# Patient Record
Sex: Female | Born: 1980
Health system: Southern US, Community
[De-identification: ages and names within clinical notes are randomized; demographics above are authoritative.]

## PROBLEM LIST (undated history)

## (undated) DIAGNOSIS — D649 Anemia, unspecified: Secondary | ICD-10-CM

## (undated) DIAGNOSIS — M549 Dorsalgia, unspecified: Secondary | ICD-10-CM

## (undated) DIAGNOSIS — M25569 Pain in unspecified knee: Secondary | ICD-10-CM

## (undated) DIAGNOSIS — E669 Obesity, unspecified: Secondary | ICD-10-CM

## (undated) HISTORY — DX: Dorsalgia, unspecified: M54.9

## (undated) HISTORY — DX: Pain in unspecified knee: M25.569

## (undated) HISTORY — DX: Anemia, unspecified: D64.9

## (undated) HISTORY — DX: Obesity, unspecified: E66.9

---

## 1998-07-06 ENCOUNTER — Emergency Department (HOSPITAL_COMMUNITY): Admission: EM | Admit: 1998-07-06 | Discharge: 1998-07-06 | Payer: Self-pay | Admitting: Emergency Medicine

## 1998-07-06 ENCOUNTER — Encounter: Payer: Self-pay | Admitting: Emergency Medicine

## 1998-07-15 ENCOUNTER — Emergency Department (HOSPITAL_COMMUNITY): Admission: EM | Admit: 1998-07-15 | Discharge: 1998-07-15 | Payer: Self-pay | Admitting: Emergency Medicine

## 1998-07-15 ENCOUNTER — Encounter: Payer: Self-pay | Admitting: Emergency Medicine

## 1999-04-04 ENCOUNTER — Ambulatory Visit (HOSPITAL_COMMUNITY): Admission: RE | Admit: 1999-04-04 | Discharge: 1999-04-04 | Payer: Self-pay | Admitting: Obstetrics

## 1999-08-12 ENCOUNTER — Ambulatory Visit (HOSPITAL_COMMUNITY): Admission: RE | Admit: 1999-08-12 | Discharge: 1999-08-12 | Payer: Self-pay | Admitting: Obstetrics

## 1999-09-16 ENCOUNTER — Encounter (HOSPITAL_COMMUNITY): Admission: RE | Admit: 1999-09-16 | Discharge: 1999-09-27 | Payer: Self-pay | Admitting: *Deleted

## 1999-09-26 ENCOUNTER — Inpatient Hospital Stay (HOSPITAL_COMMUNITY): Admission: AD | Admit: 1999-09-26 | Discharge: 1999-09-28 | Payer: Self-pay | Admitting: *Deleted

## 2003-10-01 ENCOUNTER — Encounter: Admission: RE | Admit: 2003-10-01 | Discharge: 2003-10-01 | Payer: Self-pay | Admitting: Obstetrics and Gynecology

## 2004-01-19 ENCOUNTER — Other Ambulatory Visit: Admission: RE | Admit: 2004-01-19 | Discharge: 2004-01-19 | Payer: Self-pay | Admitting: Family Medicine

## 2004-01-19 ENCOUNTER — Encounter: Admission: RE | Admit: 2004-01-19 | Discharge: 2004-01-19 | Payer: Self-pay | Admitting: Family Medicine

## 2004-09-20 ENCOUNTER — Ambulatory Visit: Payer: Self-pay | Admitting: Obstetrics and Gynecology

## 2004-09-29 ENCOUNTER — Other Ambulatory Visit: Admission: RE | Admit: 2004-09-29 | Discharge: 2004-09-29 | Payer: Self-pay | Admitting: Obstetrics and Gynecology

## 2004-09-29 ENCOUNTER — Ambulatory Visit: Payer: Self-pay | Admitting: Family Medicine

## 2011-06-05 DIAGNOSIS — Z3042 Encounter for surveillance of injectable contraceptive: Secondary | ICD-10-CM | POA: Insufficient documentation

## 2012-05-21 DIAGNOSIS — R87610 Atypical squamous cells of undetermined significance on cytologic smear of cervix (ASC-US): Secondary | ICD-10-CM | POA: Insufficient documentation

## 2013-06-18 DIAGNOSIS — N871 Moderate cervical dysplasia: Secondary | ICD-10-CM | POA: Insufficient documentation

## 2014-06-04 ENCOUNTER — Other Ambulatory Visit: Payer: Self-pay | Admitting: Obstetrics and Gynecology

## 2014-06-05 LAB — CYTOLOGY - PAP

## 2015-10-12 DIAGNOSIS — Z3042 Encounter for surveillance of injectable contraceptive: Secondary | ICD-10-CM | POA: Diagnosis not present

## 2015-12-28 DIAGNOSIS — Z3040 Encounter for surveillance of contraceptives, unspecified: Secondary | ICD-10-CM | POA: Diagnosis not present

## 2016-03-14 DIAGNOSIS — Z3040 Encounter for surveillance of contraceptives, unspecified: Secondary | ICD-10-CM | POA: Diagnosis not present

## 2016-06-07 DIAGNOSIS — Z0000 Encounter for general adult medical examination without abnormal findings: Secondary | ICD-10-CM | POA: Diagnosis not present

## 2016-06-07 DIAGNOSIS — Z3042 Encounter for surveillance of injectable contraceptive: Secondary | ICD-10-CM | POA: Diagnosis not present

## 2016-06-07 DIAGNOSIS — Z6841 Body Mass Index (BMI) 40.0 and over, adult: Secondary | ICD-10-CM | POA: Diagnosis not present

## 2016-07-17 DIAGNOSIS — Z6841 Body Mass Index (BMI) 40.0 and over, adult: Secondary | ICD-10-CM | POA: Diagnosis not present

## 2016-07-17 DIAGNOSIS — Z01419 Encounter for gynecological examination (general) (routine) without abnormal findings: Secondary | ICD-10-CM | POA: Diagnosis not present

## 2016-09-06 DIAGNOSIS — Z3040 Encounter for surveillance of contraceptives, unspecified: Secondary | ICD-10-CM | POA: Diagnosis not present

## 2016-10-16 DIAGNOSIS — R87613 High grade squamous intraepithelial lesion on cytologic smear of cervix (HGSIL): Secondary | ICD-10-CM | POA: Diagnosis not present

## 2016-10-16 DIAGNOSIS — N871 Moderate cervical dysplasia: Secondary | ICD-10-CM | POA: Diagnosis not present

## 2016-11-28 DIAGNOSIS — R87613 High grade squamous intraepithelial lesion on cytologic smear of cervix (HGSIL): Secondary | ICD-10-CM | POA: Diagnosis not present

## 2016-11-28 DIAGNOSIS — Z3042 Encounter for surveillance of injectable contraceptive: Secondary | ICD-10-CM | POA: Diagnosis not present

## 2016-11-28 DIAGNOSIS — N871 Moderate cervical dysplasia: Secondary | ICD-10-CM | POA: Diagnosis not present

## 2017-01-02 ENCOUNTER — Encounter: Payer: Self-pay | Admitting: Gynecology

## 2017-01-02 ENCOUNTER — Telehealth: Payer: Self-pay | Admitting: Gynecology

## 2017-01-02 NOTE — Telephone Encounter (Signed)
Appt scheduled for the pt to see Dr. Stanford Breedlarke-Pearson on 7/13 at 945am. Will fax a letter to the referring office to notify the pt and mail a letter to the pt.

## 2017-01-12 ENCOUNTER — Ambulatory Visit: Payer: Self-pay | Admitting: Gynecology

## 2017-01-30 NOTE — Progress Notes (Signed)
Consult Note: Gyn-Onc New Patient Visit  Elizabeth Escobar 36 y.o. female  CC:  Chief Complaint  Patient presents with  . CIN III with severe dysplasia    HPI: Patient is seen today in consultation at the request of Dr. Zelphia CairoGretchen Escobar. Primary care physician Dr. Asencion Escobar Andy.  Patient is a very pleasant 36 year old gravida 2 para 1 who is referred to us secondary to high grade dysplasia on LEEP specimen. The patient states that she has had abnormal Pap smears for about 17 years and had biopsies previously but has never had an excisional procedure before. She underwent a LEEP on 11/28/2016. Pathology revealed on the anterior portion of the cervix high-grade dysplasia CIN 2 with negative margin. On the posterior LEEP specimen showed high-grade dysplasia involving the transformation zone. There was extensive involvement of the endocervical glands. There is no invasion. She had a positive margin. On the procedure note is very clear that they did use roller ball cautery to cauterize all edges and the base of the cervical canal. She is here for discussion of follow-up with these results in mind. She's currently on Depo-Provera which she's been on for 6 years. She received her last injection on May 28. She's not currently sexually active. In the past when she has been she's not experienced any dyspareunia or postcoital bleeding. She really denies any complaints of abdominal or pelvic pain. She denies a change in her bowel or bladder habits. She states that she voids a great deal but also thinks that she drinks a lot of water. She's had one spontaneous vaginal delivery. She has a son who 38 years old. She thinks her mother had a type of cancer who and she received chemotherapy but she does not know the details. Her mother is still living. She has a sister who has lupus involving the skin.  Review of Systems: As above  Current Meds:  No outpatient encounter prescriptions on file as of 01/31/2017.   No  facility-administered encounter medications on file as of 01/31/2017.    Allergy: No Known Allergies  Social Hx:   Social History   Social History  . Marital status: Single    Spouse name: N/A  . Number of children: N/A  . Years of education: N/A   Occupational History  . Not on file.   Social History Main Topics  . Smoking status: Never Smoker  . Smokeless tobacco: Never Used  . Alcohol use No  . Drug use: No  . Sexual activity: Not on file   Other Topics Concern  . Not on file   Social History Narrative  . No narrative on file    Past Surgical Hx: History reviewed. No pertinent surgical history.  Past Medical Hx: History reviewed. No pertinent past medical history.  Oncology Hx:   No history exists.    Family Hx:  Family History  Problem Relation Age of Onset  . Cancer Mother   . Lupus Sister     Vitals:  Blood pressure (!) 156/95, pulse 71, temperature 98.3 F (36.8 C), temperature source Oral, resp. rate 18, weight (!) 386 lb 8 oz (175.3 kg), SpO2 99 %.  Physical Exam: Well-nourished well-developed female in no acute distress.  Assessment/Plan: 36 year old gravida 2 para 1 status post LEEP with high-grade dysplasia and positive endocervical margins. She and I discussed the results. I discussed with her that this has an approximate 15% risk of recurrence. The 3 options rediscussed were conservative follow-up with repeat Pap smear 6 months,  repeat excisional procedure to obtain negative margins, ultimately hysterectomy. While in some way she would be interested hysterectomy she cannot take time off work. Also discussed with her that her morbid obesity would pose a significant challenge from the surgical perspective. After discussion she wishes to have a repeat Pap smear in 6 months. I think that this is reasonable. Should she have persistent dysplasia at that time that I think further consideration of a repeat excisional procedure versus definitive therapy would be  indicated.  We discussed her obesity as it is probably a longer term health issue that even her dysplasia. She states that her insurance will not cover bariatric surgery and that she has asked about this before. She states she's also spoken to her primary care physician about medications for weight loss and those have not been encouraged for her. She has been seen by a nutritionist in the past. She got somewhat tearful today discussing the challenges with weight loss as she works 2 jobs and is very tired and does not feel like she can adequately exercise. She states that her current primary physician will be retiring and I suggested that when she has a new provider to bring this issue up again. [facility] that [facility] by family practice and that would be an option for her.  For her cervical dysplasia at this time she wishes to follow-up with Dr. Renaldo FiddlerAdkins in 6 months. I discussed with her that I would send Dr. Renaldo FiddlerAdkins a note from today. Encouraged her to call Dr. Renaldo FiddlerAdkins office and schedule an appointment.  We appreciate the opportunity to partner in the care of this very pleasant patient. She has my contact information and is welcome to call me should she have any questions.  Dorina Ribaudo A., MD 01/31/2017, 9:01 AM

## 2017-01-31 ENCOUNTER — Encounter: Payer: Self-pay | Admitting: Gynecologic Oncology

## 2017-01-31 ENCOUNTER — Ambulatory Visit: Payer: BLUE CROSS/BLUE SHIELD | Attending: Gynecologic Oncology | Admitting: Gynecologic Oncology

## 2017-01-31 VITALS — BP 156/95 | HR 71 | Temp 98.3°F | Resp 18 | Wt 386.5 lb

## 2017-01-31 DIAGNOSIS — Z8269 Family history of other diseases of the musculoskeletal system and connective tissue: Secondary | ICD-10-CM | POA: Insufficient documentation

## 2017-01-31 DIAGNOSIS — Z809 Family history of malignant neoplasm, unspecified: Secondary | ICD-10-CM | POA: Diagnosis not present

## 2017-01-31 DIAGNOSIS — D069 Carcinoma in situ of cervix, unspecified: Secondary | ICD-10-CM | POA: Insufficient documentation

## 2017-01-31 NOTE — Patient Instructions (Addendum)
Follow up with Dr. Renaldo FiddlerAdkins as scheduled.  Call Dr. Denman GeorgeGehrig's office if any concerns.

## 2017-02-14 DIAGNOSIS — Z3042 Encounter for surveillance of injectable contraceptive: Secondary | ICD-10-CM | POA: Diagnosis not present

## 2017-04-13 ENCOUNTER — Ambulatory Visit
Admission: RE | Admit: 2017-04-13 | Discharge: 2017-04-13 | Disposition: A | Discharge status: Home / Self Care | Payer: BLUE CROSS/BLUE SHIELD | Source: Ambulatory Visit | Attending: Sports Medicine | Admitting: Sports Medicine

## 2017-04-13 ENCOUNTER — Encounter (INDEPENDENT_AMBULATORY_CARE_PROVIDER_SITE_OTHER): Payer: Self-pay

## 2017-04-13 ENCOUNTER — Ambulatory Visit (INDEPENDENT_AMBULATORY_CARE_PROVIDER_SITE_OTHER): Payer: BLUE CROSS/BLUE SHIELD | Admitting: Sports Medicine

## 2017-04-13 ENCOUNTER — Other Ambulatory Visit: Payer: Self-pay | Admitting: Family Medicine

## 2017-04-13 VITALS — BP 151/71 | Ht 64.5 in | Wt 374.0 lb

## 2017-04-13 DIAGNOSIS — M25562 Pain in left knee: Principal | ICD-10-CM

## 2017-04-13 DIAGNOSIS — M25561 Pain in right knee: Secondary | ICD-10-CM

## 2017-04-13 DIAGNOSIS — M179 Osteoarthritis of knee, unspecified: Secondary | ICD-10-CM | POA: Diagnosis not present

## 2017-04-13 MED ORDER — MELOXICAM 15 MG PO TABS: 15.0000 mg | ORAL_TABLET | Freq: Every day | ORAL | 1 refills | Status: DC

## 2017-04-13 NOTE — Progress Notes (Signed)
Chief complaint: Bilateral knee pain, left greater than right 6 months  History of present illness: Elizabeth Escobar is a 36 year old female who presents to the sports medicine office today with chief complaint of bilateral knee pain. She reports that symptoms have been present for proximally 6 months. She reports that her left knee is more bothersome than her right knee. She reports that she does not recall of any inciting incident, trauma, or injury to explain the pain. She describes the pain as a sharp throbbing pain. She points to both the medial lateral joint lines of both knees as to where pain is at. She reports that she does work 2 jobs, one at [employer] and 1 at [employer], stands for good 10 to 12 hours a day on a concrete floor. She does wear tennis shoes. She reports pain is aggravated by knee flexion and excess standing. She reports that she has tried occasional Aleve, with minimal improvement in her symptoms. She does report of popping, locking, catching sensation, does not report of any symptoms of giving way. Does not report of any numbness, tingling, or burning paresthesias. She does not report of any hip pain or low back pain. She is not report of any previous knee injury. She does report a family history of osteoarthritis. He reports pain has been gradually worsening over the last 6 months, today describes the pain as a 4/10. Pain does not wake her up at nighttime. Has not tried cryotherapy or bracing.  Review of systems:  As stated above  Her past medical history, surgical history, family history, and social history reviewed. She is morbidly obese with a BMI of 63, no history of diabetes, she does not report of any current tobacco use. She does report a family history of osteoarthritis. No surgeries involving knees.  Physical exam: Vital signs are reviewed and are documented in the chart Gen.: Alert, oriented, appears stated age, in no apparent distress, is super morbidly obese HEENT: Moist  oral mucosa Respiratory: Normal respirations, able to speak in full sentences Cardiac: Regular rate, distal pulses 2+ Integumentary: No rashes on visible skin:  Neurologic: Strength 5/5, sensation 2+ in bilateral lower extremities Psych: Normal affect, mood is described as good Musculoskeletal: Inspection of her bilateral knees reveal no obvious deformity or muscle atrophy, no warmth, erythema, ecchymosis noted, difficult to appreciate any effusion due to her body habitus and size of her knee, she is tender to palpation over the medial lateral joint line bilaterally, range of motion is from 0 to 110 on the right and from 0 to 90 on the left, evidence of ligamentous instability  Assessment and plan: 1. Bilateral knee pain, suspect from primary osteoarthritis 2. Super morbid obesity with BMI of 63  Bilateral knee pain -Discussed with patient that symptoms are most consistent with primary osteoarthritis, no evidence of ligamentous instability -Discussed that she will need to get x-ray to further evaluate her knees -Discussed weight loss and excess weight causing increased pressure and strain on her knees -Did have patient fitted with green insoles for comfort and support, discussed of custom orthotics if she would like to have this done in one to 2 months -Will prescribe meloxicam 15 mg daily for the next 2 weeks, then as needed thereafter afterwards  She will follow-up here on as-needed basis.   Haynes Kerns, M.D. Primary Care Sports Medicine Fellow [facility]

## 2017-04-17 ENCOUNTER — Telehealth: Payer: Self-pay | Admitting: Family Medicine

## 2017-04-17 NOTE — Telephone Encounter (Signed)
Called the patient's cell phone number, she did not answer her cell phone.  Left detailed voice message regarding her X-ray results of her bilateral knees.  Discussed with her that she does have severe medial compartment degenerative osteoarthritis of her right knee, minimal osteoarthritis in her left knee.  Discussed to continue with meloxicam 15 mg daily for the next 2 weeks, then daily as needed there afterwards.  Discussed importance of weight loss.  Discussed to call office with any additional questions. Discussed to call if she would like to have custom orthotics.  Haynes Kerns, MD Primary Care Sports Medicine Fellow [facility]

## 2017-04-18 ENCOUNTER — Other Ambulatory Visit: Payer: Self-pay

## 2017-04-18 MED ORDER — MELOXICAM 15 MG PO TABS: 15.0000 mg | ORAL_TABLET | Freq: Every day | ORAL | 1 refills | Status: AC

## 2017-05-02 DIAGNOSIS — Z3042 Encounter for surveillance of injectable contraceptive: Secondary | ICD-10-CM | POA: Diagnosis not present

## 2017-07-20 DIAGNOSIS — Z3042 Encounter for surveillance of injectable contraceptive: Secondary | ICD-10-CM | POA: Diagnosis not present

## 2017-08-30 DIAGNOSIS — Z01419 Encounter for gynecological examination (general) (routine) without abnormal findings: Secondary | ICD-10-CM | POA: Diagnosis not present

## 2017-08-30 DIAGNOSIS — Z6841 Body Mass Index (BMI) 40.0 and over, adult: Secondary | ICD-10-CM | POA: Diagnosis not present

## 2017-08-30 DIAGNOSIS — R87613 High grade squamous intraepithelial lesion on cytologic smear of cervix (HGSIL): Secondary | ICD-10-CM | POA: Diagnosis not present

## 2017-10-12 DIAGNOSIS — Z6841 Body Mass Index (BMI) 40.0 and over, adult: Secondary | ICD-10-CM | POA: Diagnosis not present

## 2017-10-12 DIAGNOSIS — Z3042 Encounter for surveillance of injectable contraceptive: Secondary | ICD-10-CM | POA: Diagnosis not present

## 2017-10-12 DIAGNOSIS — Z0000 Encounter for general adult medical examination without abnormal findings: Secondary | ICD-10-CM | POA: Diagnosis not present

## 2017-12-28 DIAGNOSIS — Z3042 Encounter for surveillance of injectable contraceptive: Secondary | ICD-10-CM | POA: Diagnosis not present

## 2018-03-15 DIAGNOSIS — Z3042 Encounter for surveillance of injectable contraceptive: Secondary | ICD-10-CM | POA: Diagnosis not present

## 2018-06-07 DIAGNOSIS — Z3042 Encounter for surveillance of injectable contraceptive: Secondary | ICD-10-CM | POA: Diagnosis not present

## 2018-06-13 DIAGNOSIS — M79652 Pain in left thigh: Secondary | ICD-10-CM | POA: Diagnosis not present

## 2018-07-16 DIAGNOSIS — M79652 Pain in left thigh: Secondary | ICD-10-CM | POA: Diagnosis not present

## 2018-07-18 DIAGNOSIS — M79652 Pain in left thigh: Secondary | ICD-10-CM | POA: Diagnosis not present

## 2018-08-23 DIAGNOSIS — Z3042 Encounter for surveillance of injectable contraceptive: Secondary | ICD-10-CM | POA: Diagnosis not present

## 2018-11-15 DIAGNOSIS — Z3042 Encounter for surveillance of injectable contraceptive: Secondary | ICD-10-CM | POA: Diagnosis not present

## 2019-01-01 DIAGNOSIS — R87611 Atypical squamous cells cannot exclude high grade squamous intraepithelial lesion on cytologic smear of cervix (ASC-H): Secondary | ICD-10-CM | POA: Diagnosis not present

## 2019-01-01 DIAGNOSIS — Z6841 Body Mass Index (BMI) 40.0 and over, adult: Secondary | ICD-10-CM | POA: Diagnosis not present

## 2019-01-01 DIAGNOSIS — Z01419 Encounter for gynecological examination (general) (routine) without abnormal findings: Secondary | ICD-10-CM | POA: Diagnosis not present

## 2019-01-31 DIAGNOSIS — Z3042 Encounter for surveillance of injectable contraceptive: Secondary | ICD-10-CM | POA: Diagnosis not present

## 2019-05-02 DIAGNOSIS — Z3042 Encounter for surveillance of injectable contraceptive: Secondary | ICD-10-CM | POA: Diagnosis not present

## 2019-05-14 ENCOUNTER — Other Ambulatory Visit: Payer: Self-pay

## 2019-05-14 ENCOUNTER — Encounter: Payer: Self-pay | Admitting: Family Medicine

## 2019-05-14 ENCOUNTER — Encounter (INDEPENDENT_AMBULATORY_CARE_PROVIDER_SITE_OTHER): Payer: Self-pay | Admitting: Family Medicine

## 2019-05-14 ENCOUNTER — Ambulatory Visit (INDEPENDENT_AMBULATORY_CARE_PROVIDER_SITE_OTHER): Payer: Self-pay | Admitting: Family Medicine

## 2019-05-14 ENCOUNTER — Ambulatory Visit (INDEPENDENT_AMBULATORY_CARE_PROVIDER_SITE_OTHER): Payer: BC Managed Care – PPO | Admitting: Family Medicine

## 2019-05-14 VITALS — BP 140/80 | HR 82 | Temp 98.1°F | Ht 65.0 in | Wt >= 6400 oz

## 2019-05-14 DIAGNOSIS — R358 Other polyuria: Secondary | ICD-10-CM

## 2019-05-14 DIAGNOSIS — F3289 Other specified depressive episodes: Secondary | ICD-10-CM

## 2019-05-14 DIAGNOSIS — G479 Sleep disorder, unspecified: Secondary | ICD-10-CM

## 2019-05-14 DIAGNOSIS — R739 Hyperglycemia, unspecified: Secondary | ICD-10-CM | POA: Diagnosis not present

## 2019-05-14 DIAGNOSIS — R5383 Other fatigue: Secondary | ICD-10-CM | POA: Diagnosis not present

## 2019-05-14 DIAGNOSIS — M25562 Pain in left knee: Secondary | ICD-10-CM

## 2019-05-14 DIAGNOSIS — Z9189 Other specified personal risk factors, not elsewhere classified: Secondary | ICD-10-CM | POA: Diagnosis not present

## 2019-05-14 DIAGNOSIS — R0602 Shortness of breath: Secondary | ICD-10-CM | POA: Diagnosis not present

## 2019-05-14 DIAGNOSIS — R3589 Other polyuria: Secondary | ICD-10-CM

## 2019-05-14 DIAGNOSIS — Z0289 Encounter for other administrative examinations: Secondary | ICD-10-CM

## 2019-05-14 NOTE — Progress Notes (Signed)
Office: [phone]  /  Fax: [phone]   Dear Elizabeth Claude, NP,   Thank you for referring Elizabeth Escobar to our clinic. The following note includes my evaluation and treatment recommendations.  HPI:   Chief Complaint: OBESITY    Scientist, forensic has been referred by Elizabeth Solomons, NP for consultation regarding her obesity and obesity related comorbidities.    Elizabeth Escobar (MR# [id number]) is a 38 y.o. female who presents on 05/14/2019 for obesity evaluation and treatment. Current BMI is Body mass index is 70.89 kg/m.Marland Kitchen Elizabeth Escobar has been struggling with her weight for many years and has been unsuccessful in either losing weight, maintaining weight loss, or reaching her healthy weight goal.     Elizabeth Escobar states she is currently in the action stage of change and ready to dedicate time achieving and maintaining a healthier weight. Elizabeth Escobar is interested in becoming our patient and working on intensive lifestyle modifications including (but not limited to) diet, exercise and weight loss.    Elizabeth Escobar states she thinks her family will eat healthier with her she struggles with family and or coworkers weight loss sabotage she has been heavy most of her life she started gaining weight at 38 years old her heaviest weight ever was 426 lbs. she craves cookies, chips, grapes and fruit she skips lunch frequently she is frequently drinking liquids with calories she has binge eating behaviors   Fatigue Elizabeth Escobar feels her energy is lower than it should be. This has worsened with weight gain and has worsened recently. Elizabeth Escobar denies daytime somnolence and  admits to waking up still tired. Patient generally gets 7 hours of sleep per night, and states they generally do not sleep well most nights. Snoring is present. Apneic episodes are not present. Epworth Sleepiness Score is 15. Elizabeth Escobar works 40-60 hours per week - 2 jobs - and is on her feet most of the day. She reports poor quality of  sleep.  Dyspnea on exertion Elizabeth Escobar notes increasing shortness of breath with exercising and seems to be worsening over time with weight gain. She notes getting out of breath sooner with activity than she used to. This has gotten worse recently. Elizabeth Escobar denies orthopnea.  Hyperglycemia Elizabeth Escobar has a history of some elevated blood glucose readings without a diagnosis of diabetes. She drinks sugar based drinks - regular soda, sweet tea, and smoothies.  At risk for diabetes Elizabeth Escobar is at higher than average risk for developing diabetes due to her obesity. She currently denies polyuria or polydipsia.  Depression with emotional eating behaviors Elizabeth Escobar is struggling with emotional eating and using food for comfort to the extent that it is negatively impacting her health. She often snacks when she is not hungry. Elizabeth Escobar sometimes feels she is out of control and then feels guilty that she made poor food choices. She has been working on behavior modification techniques to help reduce her emotional eating and has been somewhat successful. Elizabeth Escobar states she craves sweets. Her PHQ-9 score today was 18. She shows no sign of suicidal or homicidal ideations.  Depression Screen Elizabeth Escobar's Food and Mood (modified PHQ-9) score was 18. Depression screen PHQ 2/9 05/14/2019  Decreased Interest 3  Down, Depressed, Hopeless 3  PHQ - 2 Score 6  Altered sleeping 0  Tired, decreased energy 3  Change in appetite 0  Feeling bad or failure about yourself  3  Trouble concentrating 3  Moving slowly or fidgety/restless 3  Suicidal thoughts 0  PHQ-9 Score 18  Difficult doing work/chores Not difficult at all  Sleep Disordered Breathing Elizabeth Escobar has a diagnosis of sleep disordered breathing with an Epworth score of 15.  Polyuria at Night Elizabeth Escobar complains of getting up to urinate every 2 hours after getting into bed. She denies dysuria, hematuria, or overt edema.  Left Knee Pain Elizabeth Escobar has left knee pain with  no mobility. She was previously seen by Sports Medicine and was told to lose weight.  ASSESSMENT AND PLAN:  Other fatigue - Plan: EKG 12-Lead, CBC w/Diff/Platelet, T3, T4, free, TSH, B12, Vitamin D (25 hydroxy)  Shortness of breath on exertion - Plan: Lipid Panel With LDL/HDL Ratio  Hyperglycemia - Plan: Comprehensive Metabolic Panel (CMET), HgB A1c, Insulin, random  Left knee pain, unspecified chronicity - Plan: B Nat Peptide  Sleep disorder - Plan: Ambulatory referral to Sleep Studies  Polyuria  Other depression  At risk for diabetes mellitus  PLAN:  Fatigue Elizabeth Escobar was informed that her fatigue may be related to obesity, depression or many other causes. Labs will be ordered, and in the meanwhile Elizabeth Escobar has agreed to work on diet, exercise and weight loss to help with fatigue. Proper sleep hygiene was discussed including the need for 7-8 hours of quality sleep each night. A sleep study was ordered based on symptoms and Epworth score.  Dyspnea on exertion Merdis's shortness of breath appears to be obesity related and exercise induced. She has agreed to work on weight loss and gradually increase exercise to treat her exercise induced shortness of breath. If Elizabeth Escobar follows our instructions and loses weight without improvement of her shortness of breath, we will plan to refer to pulmonology. We will monitor this condition regularly. Elizabeth Escobar agrees to this plan.  Hyperglycemia Fasting labs will be obtained and results with be discussed with Elizabeth Escobar in 2 weeks at her follow up visit. In the meanwhile Elizabeth Escobar was started on a lower simple carbohydrate diet and will work on weight loss efforts.  Diabetes risk counseling Elizabeth Escobar was given extended (15 minutes) diabetes prevention counseling today. She is 38 y.o. female and has risk factors for diabetes including obesity. We discussed intensive lifestyle modifications today with an emphasis on weight loss as well as increasing exercise  and decreasing simple carbohydrates in her diet.  Depression with Emotional Eating Behaviors We discussed behavior modification techniques today to help Elizabeth Escobar deal with her emotional eating and depression. She has agreed to take Wellbutrin SR 150 mg qd and agreed to follow up as directed.  Depression Screen Elizabeth Escobar had a strongly positive depression screening. Depression is commonly associated with obesity and often results in emotional eating behaviors. We will monitor this closely and work on CBT to help improve the non-hunger eating patterns. Referral to Psychology may be required if no improvement is seen as she continues in our clinic.  Sleep Disordered Breathing Elizabeth Escobar will be referred for a sleep study and follow-up as directed.  Polyuria at Elizabeth Escobar will have labs today including BNP, insulin, and A1c. She will follow-up in 2 weeks for review of lab results.  Left Knee Pain We will monitor her left knee pain.  Obesity Elizabeth Escobar is currently in the action stage of change and her goal is to continue with weight loss efforts. I recommend Elizabeth Escobar begin the structured treatment plan as follows:  She has agreed to follow the Category 4 plan.  Elizabeth Escobar has been instructed to exercise as tolerated secondary to osteoarthritis of the left knee for weight loss and overall health benefits. We discussed the following Behavioral Modification Strategies today: increasing lean  protein intake, decreasing simple carbohydrates, increasing vegetables, increase H20 intake, decrease liquid calories, work on meal planning and easy cooking plans, and planning for success.   She was informed of the importance of frequent follow-up visits to maximize her success with intensive lifestyle modifications for her multiple health conditions. She was informed we would discuss her lab results at her next visit unless there is a critical issue that needs to be addressed sooner. Elizabeth Escobar agreed to keep her next  visit at the agreed upon time to discuss these results.  ALLERGIES: No Known Allergies  MEDICATIONS: Current Outpatient Medications on File Prior to Visit  Medication Sig Dispense Refill  . cetirizine (ZYRTEC) 10 MG tablet Take 10 mg by mouth daily.    Marland Kitchen ibuprofen (ADVIL) 200 MG tablet Take 200 mg by mouth every 6 (six) hours as needed.    . medroxyPROGESTERone (DEPO-PROVERA) 150 MG/ML injection Inject 150 mg into the muscle every 3 (three) months.    . Multiple Vitamins-Minerals (MULTIVITAMIN ADULT PO) Take by mouth.     No current facility-administered medications on file prior to visit.    PAST MEDICAL HISTORY: Past Medical History:  Diagnosis Date  . Anemia   . Back pain   . Knee pain   . Obesity     PAST SURGICAL HISTORY: History reviewed. No pertinent surgical history.  SOCIAL HISTORY: Social History   Tobacco Use  . Smoking status: Never Smoker  . Smokeless tobacco: Never Used  Substance Use Topics  . Alcohol use: No  . Drug use: No   FAMILY HISTORY: Family History  Problem Relation Age of Onset  . Cancer Mother   . Obesity Mother   . Lupus Sister    ROS: Review of Systems  Eyes:       Positive for wearing glasses or contacts.  Respiratory: Positive for shortness of breath (with activity) and wheezing.        Positive for sleep disordered breathing.  Cardiovascular: Negative for orthopnea.  Gastrointestinal: Positive for abdominal pain (right upper quadrant) and heartburn.  Genitourinary:       Positive for polyuria at night.  Musculoskeletal: Positive for back pain.       Positive for left knee pain.  Neurological:       Positive for leg cramping.  Psychiatric/Behavioral: Positive for depression. Negative for suicidal ideas.       Negative for homicidal ideas.   PHYSICAL EXAM: Blood pressure 140/80, pulse 82, temperature 98.1 F (36.7 C), temperature source Oral, height 5\' 5"  (1.651 m), weight (!) 426 lb (193.2 kg), SpO2 97 %. Body mass index is  70.89 kg/m. Physical Exam Vitals signs reviewed.  Constitutional:      Appearance: Normal appearance. She is well-developed. She is obese.  HENT:     Head: Normocephalic and atraumatic.     Nose: Nose normal.  Eyes:     General: No scleral icterus. Neck:     Musculoskeletal: Normal range of motion.  Cardiovascular:     Rate and Rhythm: Normal rate and regular rhythm.  Pulmonary:     Effort: Pulmonary effort is normal. No respiratory distress.  Abdominal:     Palpations: Abdomen is soft.     Tenderness: There is no abdominal tenderness.  Musculoskeletal: Normal range of motion.     Comments: Range of motion normal in all four extremities.  Skin:    General: Skin is warm and dry.  Neurological:     Mental Status: She is alert and oriented  to person, place, and time.     Coordination: Coordination normal.  Psychiatric:        Mood and Affect: Mood and affect normal.        Behavior: Behavior normal.   RECENT LABS AND TESTS: BMET No results found for: NA, K, CL, CO2, GLUCOSE, BUN, CREATININE, CALCIUM, GFRNONAA, GFRAA No results found for: HGBA1C No results found for: INSULIN CBC No results found for: WBC, RBC, HGB, HCT, PLT, MCV, MCH, MCHC, RDW, LYMPHSABS, MONOABS, EOSABS, BASOSABS Iron/TIBC/Ferritin/ %Sat No results found for: IRON, TIBC, FERRITIN, IRONPCTSAT Lipid Panel  No results found for: CHOL, TRIG, HDL, CHOLHDL, VLDL, LDLCALC, LDLDIRECT Hepatic Function Panel  No results found for: PROT, ALBUMIN, AST, ALT, ALKPHOS, BILITOT, BILIDIR, IBILI No results found for: TSH   No results found for: Vitamin D, 25-Hydroxy  ECG shows sinus rhythm with a rate of 78 BPM. Low voltage in precordial leads. Consider old inferior infarct. Old anterior infarct. Nonspecific T-abnormality. Abnormal.  INDIRECT CALORIMETER done today shows a VO2 of 332 and a REE of 2312.  Her calculated basal metabolic rate is [id number] thus her basal metabolic rate is better than expected.  OBESITY  BEHAVIORAL INTERVENTION VISIT  Today's visit was #1   Starting weight: 426 lbs Starting date: 05/14/2019 Today's weight: 426 lbs  Today's date: 05/14/2019 Total lbs lost to date: 0    05/14/2019  Height 5\' 5"  (1.651 m)  Weight 426 lb (193.2 kg) (A)  BMI (Calculated) 70.89  BLOOD PRESSURE - SYSTOLIC 140  BLOOD PRESSURE - DIASTOLIC 80  Waist Measurement  64 inches   Body Fat % 69.4 %  RMR 2312   ASK: We discussed the diagnosis of obesity with Elizabeth Hankin today and Ailish agreed to give us permission to discuss obesity behavioral modification therapy today.  ASSESS: Samari has the diagnosis of obesity and her BMI today is 71.0. Waynesha is in the action stage of change.   ADVISE: Sonny MastersCandace was educated on the multiple health risks of obesity as well as the benefit of weight loss to improve her health. She was advised of the need for long term treatment and the importance of lifestyle modifications to improve her current health and to decrease her risk of future health problems.  AGREE: Multiple dietary modification options and treatment options were discussed and  Yana agreed to follow the recommendations documented in the above note.  ARRANGE: Jazzlyn was educated on the importance of frequent visits to treat obesity as outlined per CMS and USPSTF guidelines and agreed to schedule her next follow up appointment today.  IMarianna Payment, Denise Haag, am acting as Energy managertranscriptionist for Dean Foods CompanyErica R. Earlene PlaterWallace, DO  I have reviewed the above documentation for accuracy and completeness, and I agree with the above. Helane Rima- Cassie Shedlock, DO

## 2019-05-15 ENCOUNTER — Encounter (INDEPENDENT_AMBULATORY_CARE_PROVIDER_SITE_OTHER): Payer: Self-pay | Admitting: Family Medicine

## 2019-05-15 DIAGNOSIS — M25562 Pain in left knee: Secondary | ICD-10-CM | POA: Insufficient documentation

## 2019-05-15 DIAGNOSIS — F329 Major depressive disorder, single episode, unspecified: Secondary | ICD-10-CM | POA: Insufficient documentation

## 2019-05-15 DIAGNOSIS — R358 Other polyuria: Secondary | ICD-10-CM | POA: Insufficient documentation

## 2019-05-15 DIAGNOSIS — F32A Depression, unspecified: Secondary | ICD-10-CM | POA: Insufficient documentation

## 2019-05-15 DIAGNOSIS — R3589 Other polyuria: Secondary | ICD-10-CM | POA: Insufficient documentation

## 2019-05-15 DIAGNOSIS — G479 Sleep disorder, unspecified: Secondary | ICD-10-CM | POA: Insufficient documentation

## 2019-05-15 LAB — COMPREHENSIVE METABOLIC PANEL
ALT: 14 IU/L (ref 0–32)
AST: 15 IU/L (ref 0–40)
Albumin/Globulin Ratio: 1.5 (ref 1.2–2.2)
Albumin: 4.1 g/dL (ref 3.8–4.8)
Alkaline Phosphatase: 62 IU/L (ref 39–117)
BUN/Creatinine Ratio: 18 (ref 9–23)
BUN: 12 mg/dL (ref 6–20)
Bilirubin Total: 0.4 mg/dL (ref 0.0–1.2)
CO2: 25 mmol/L (ref 20–29)
Calcium: 9.2 mg/dL (ref 8.7–10.2)
Chloride: 101 mmol/L (ref 96–106)
Creatinine, Ser: 0.68 mg/dL (ref 0.57–1.00)
GFR calc Af Amer: 127 mL/min/{1.73_m2} (ref >59)
GFR calc non Af Amer: 111 mL/min/{1.73_m2} (ref >59)
Globulin, Total: 2.8 g/dL (ref 1.5–4.5)
Glucose: 81 mg/dL (ref 65–99)
Potassium: 4.1 mmol/L (ref 3.5–5.2)
Sodium: 139 mmol/L (ref 134–144)
Total Protein: 6.9 g/dL (ref 6.0–8.5)

## 2019-05-15 LAB — CBC WITH DIFFERENTIAL/PLATELET
Basophils Absolute: 0 10*3/uL (ref 0.0–0.2)
Basos: 1 %
EOS (ABSOLUTE): 0.1 10*3/uL (ref 0.0–0.4)
Eos: 2 %
Hematocrit: 38.2 % (ref 34.0–46.6)
Hemoglobin: 12.2 g/dL (ref 11.1–15.9)
Immature Grans (Abs): 0 10*3/uL (ref 0.0–0.1)
Immature Granulocytes: 0 %
Lymphocytes Absolute: 3.2 10*3/uL — ABNORMAL HIGH (ref 0.7–3.1)
Lymphs: 41 %
MCH: 27.1 pg (ref 26.6–33.0)
MCHC: 31.9 g/dL (ref 31.5–35.7)
MCV: 85 fL (ref 79–97)
Monocytes Absolute: 0.7 10*3/uL (ref 0.1–0.9)
Monocytes: 9 %
Neutrophils Absolute: 3.7 10*3/uL (ref 1.4–7.0)
Neutrophils: 47 %
Platelets: 279 10*3/uL (ref 150–450)
RBC: 4.51 x10E6/uL (ref 3.77–5.28)
RDW: 12.7 % (ref 11.7–15.4)
WBC: 7.7 10*3/uL (ref 3.4–10.8)

## 2019-05-15 LAB — LIPID PANEL WITH LDL/HDL RATIO
Cholesterol, Total: 161 mg/dL (ref 100–199)
HDL: 43 mg/dL (ref >39)
LDL Chol Calc (NIH): 109 mg/dL — ABNORMAL HIGH (ref 0–99)
LDL/HDL Ratio: 2.5 ratio (ref 0.0–3.2)
Triglycerides: 44 mg/dL (ref 0–149)
VLDL Cholesterol Cal: 9 mg/dL (ref 5–40)

## 2019-05-15 LAB — TSH: TSH: 2.54 u[IU]/mL (ref 0.450–4.500)

## 2019-05-15 LAB — HEMOGLOBIN A1C
Est. average glucose Bld gHb Est-mCnc: 105 mg/dL
Hgb A1c MFr Bld: 5.3 % (ref 4.8–5.6)

## 2019-05-15 LAB — VITAMIN B12: Vitamin B-12: 494 pg/mL (ref 232–1245)

## 2019-05-15 LAB — BRAIN NATRIURETIC PEPTIDE: BNP: 32.6 pg/mL (ref 0.0–100.0)

## 2019-05-15 LAB — T4, FREE: Free T4: 1.64 ng/dL (ref 0.82–1.77)

## 2019-05-15 LAB — T3: T3, Total: 146 ng/dL (ref 71–180)

## 2019-05-15 LAB — VITAMIN D 25 HYDROXY (VIT D DEFICIENCY, FRACTURES): Vit D, 25-Hydroxy: 30.5 ng/mL (ref 30.0–100.0)

## 2019-05-15 LAB — INSULIN, RANDOM: INSULIN: 18.4 u[IU]/mL (ref 2.6–24.9)

## 2019-05-22 ENCOUNTER — Encounter: Payer: Self-pay | Admitting: Neurology

## 2019-05-22 ENCOUNTER — Ambulatory Visit: Payer: BC Managed Care – PPO | Admitting: Neurology

## 2019-05-22 ENCOUNTER — Other Ambulatory Visit: Payer: Self-pay

## 2019-05-22 VITALS — BP 148/72 | HR 82 | Temp 97.7°F | Ht 64.5 in | Wt >= 6400 oz

## 2019-05-22 DIAGNOSIS — G4719 Other hypersomnia: Secondary | ICD-10-CM

## 2019-05-22 DIAGNOSIS — E669 Obesity, unspecified: Secondary | ICD-10-CM | POA: Diagnosis not present

## 2019-05-22 DIAGNOSIS — R3589 Other polyuria: Secondary | ICD-10-CM

## 2019-05-22 DIAGNOSIS — R0683 Snoring: Secondary | ICD-10-CM | POA: Insufficient documentation

## 2019-05-22 DIAGNOSIS — R358 Other polyuria: Secondary | ICD-10-CM | POA: Diagnosis not present

## 2019-05-22 DIAGNOSIS — R351 Nocturia: Secondary | ICD-10-CM | POA: Diagnosis not present

## 2019-05-22 NOTE — Progress Notes (Signed)
SLEEP MEDICINE CLINIC    Provider:  Larey Seat, MD  Primary Care Physician:  [facility] [address] [place]  [id number]     Referring Provider: Hermelinda Medicus , DO @ [facility] [place].         Chief Complaint according to patient   Patient presents with:    . 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 Mrs. Luhmann has frequent nocturia but denies polydipsia. Her BMI at 426 pounds placed her into the super obesity category. Dr Juleen China just saw her in an introductory visit.  Referred by [facility].  Chief concern according to patient :" I may have sleep apnea- I have snored for many years"   I have the pleasure of seeing Shaquanna Lycan today, a right -handed  African American female with a possible sleep disorder.  She   has a past medical history of Anemia, Back pain, Knee pain, and Obesity. Sleep relevant medical history: Nocturia every two hours. Snoring, witnessed snoring. She sleeps alone.     Family medical /sleep history: no  other family member with OSA, insomnia, sleep walkers.    Social history: Patient is working as Research scientist (life sciences) and lives in a household with 2 persons. Family status is single with 1 child in college - and with her mother.  The patient currently works in 2 jobs- [employer] Monday- Friday and weekends at [employer].Tobacco use; never,  ETOH use; none , Caffeine intake in form of Coffee( none) Soda( 1-2) Tea ( 1-2) - she just changed to to calory free sodas.  Regular exercise- none.    Sleep habits are as follows: The patient's dinner time is between 3.30- 4 PM. She skips lunch .  The patient goes to bed at [facility] and  Has no trouble to go to sleep. She continues to sleep for 2 hours, wakes for several  bathroom breaks, the first  time at 10 PM  Up to 8 hours of sleep except Mondays.  The preferred sleep position is lateral , with the support of 2 pillows. She likes to lift her feet with another pillow. Dreams are reportedly frequent/vivid. 3.30 AM is the usual rise time. The patient wakes up spontaneously before her alarm.  She reports mostly feeling refreshed and restored in AM, with symptoms such as drooling , and residual fatigue. Naps are taken frequently, lasting from 30 to 120 minutes in a recliner and are more refreshing than nocturnal sleep.    Review of Systems: Out of a complete 14 system review, the patient complains of only the following symptoms, and all other reviewed systems are negative.:  Fatigue, sleepiness , snoring, fragmented sleep,   How likely are you to doze in the following situations: 0 = not likely, 1 = slight chance, 2 = moderate chance, 3 = high chance   Sitting and Reading? Watching Television? Sitting inactive in a public place (theater or meeting)? As a passenger in a car for an hour without a break? Lying down in the afternoon when circumstances permit? Sitting and talking to someone? Sitting quietly after lunch without alcohol? In a car, while stopped for a few minutes in traffic?   Total = 14/ 24 points   FSS endorsed at  9/ 63 points.  Not depressed.   Social History   Socioeconomic History  . Marital status: Single    Spouse name: Not on file  . Number of children: Not on file  . Years of education: Not on file  . Highest education level: Not on file  Occupational History  . Not on file  Social Needs  . Financial resource strain: Not on file  . Food insecurity    Worry: Not on file    Inability: Not on file  . Transportation needs    Medical: Not on file    Non-medical: Not on file  Tobacco Use  . Smoking status: Never Smoker  . Smokeless tobacco: Never Used  Substance and Sexual Activity  . Alcohol use: No  . Drug use: No  . Sexual activity: Not on file   Lifestyle  . Physical activity    Days per week: Not on file    Minutes per session: Not on file  . Stress: Not on file  Relationships  . Social Musician on phone: Not on file    Gets together: Not on file    Attends religious service: Not on file    Active member of club or organization: Not on file    Attends meetings of clubs or organizations: Not on file    Relationship status: Not on file  Other Topics Concern  . Not on file  Social History Narrative  . Not on file    Family History  Problem Relation Age of Onset  . Cancer Mother   . Obesity Mother   . Lupus Sister     Past Medical History:  Diagnosis Date  . Anemia   . Back pain   . Knee pain   . Obesity     No past surgical history on file.   Current Outpatient Medications on File Prior to Visit  Medication Sig Dispense Refill  . cetirizine (ZYRTEC) 10 MG tablet Take 10 mg by mouth every other day.     . ibuprofen (ADVIL) 200 MG tablet Take 200 mg by mouth every 6 (six) hours as needed.    . medroxyPROGESTERone (DEPO-PROVERA) 150 MG/ML injection Inject 150 mg into the muscle every 3 (three) months.    . Multiple Vitamins-Minerals (MULTIVITAMIN ADULT PO) Take by mouth.     No current facility-administered medications on file prior to visit.     No Known Allergies  Physical exam:  Today's Vitals   05/22/19 1516  BP: (!) 148/72  Pulse: 82  Temp: 97.7 F (36.5 C)  Weight: (!) 432 lb (196 kg)  Height: 5' 4.5" (1.638 m)   Body mass index is 73.01 kg/m.   Wt Readings from Last 3 Encounters:  05/22/19 (!) 432 lb (196 kg)  05/14/19 (!) 426 lb (193.2 kg)  04/13/17 (!) 374 lb (169.6 kg)     Ht Readings from Last 3 Encounters:  05/22/19 5' 4.5" (1.638 m)  05/14/19 5\' 5"  (1.651 m)  04/13/17 5' 4.5" (1.638 m)      General: The patient is awake, alert and appears not in acute distress. The patient is well groomed. Head: Normocephalic, atraumatic. Neck is supple. Mallampati 4,  neck  circumference:16 inches . Nasal airflow  patent.  Retrognathia is not seen.  Dental status: n/a  Cardiovascular:  Regular rate and cardiac rhythm by pulse,  without distended neck veins. Respiratory: Lungs are clear to auscultation.  Skin:  Without evidence of ankle edema, or  rash. Trunk: The patient's posture is erect.   Neurologic exam : The patient is awake and alert, oriented to place and time.   Memory subjective described as intact.  Attention span & concentration ability appears normal.  Speech is fluent,  without  dysarthria, dysphonia or aphasia.  Mood and affect are appropriate.   Cranial nerves: no loss of smell or taste reported  Pupils are equal and briskly reactive to light. Funduscopic exam deferred.   Extraocular movements in vertical and horizontal planes were intact and without nystagmus. No Diplopia. Visual fields by finger perimetry are intact. Hearing was intact to soft voice and finger rubbing.    Facial sensation intact to fine touch.  Facial motor strength is symmetric and tongue and uvula move midline.  Neck ROM : rotation, tilt and flexion extension were normal for age and shoulder shrug was symmetrical.    Motor exam:  Symmetric bulk, tone and ROM.   Normal tone without cog wheeling, symmetric grip strength .   Sensory:  Fine touch, pinprick and vibration were tested  and  normal.  Proprioception tested in the upper extremities was normal.   Coordination: Rapid alternating movements in the fingers/hands were of normal speed.  The Finger-to-nose maneuver was intact without evidence of ataxia, dysmetria or tremor.   Gait and station: Patient could rise unassisted from a seated position, walked without assistive device.  Stance is of extreme width/ wide base and the patient turned with 5 steps.  Toe and heel walk were deferred.  Deep tendon reflexes: in the  upper and lower extremities are symmetric and intact.  Babinski response was deferred.      After  spending a total time of  40 minutes face to face and additional time for physical and neurologic examination, review of laboratory studies,  personal review of imaging studies, reports and results of other testing and review of referral information / records as far as provided in visit, I have established the following assessments:  1) Mrs. Rae LipsShuler is a [age] year old AAF with superobesity and high risk for obesity hypoventilation.  Her BMI is over 60.  2) She has neither HTN nor diabetes, which is encouraging. Nocturia can be solely related to apnea.  3)  She has good sleep habits , routines for bedtime and rise time.  She skips meals , which may reduce her metabolic rate.    My Plan is to proceed with:  1) Attended Sleep Study to address possible obesity hypoventilation in the same night. This would allow for PAP and oxygen treatment.  Bed 2 , please.    I would like to thank Dr Finis BudErika Wallace, DO , for allowing me to meet with and to take care of this pleasant patient.   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   I plan to follow up either personally or through our NP within 2-3 month.   CC: I will share my notes with PCP   Electronically signed by: Melvyn Novasarmen Alessa Mazur, MD 05/22/2019 3:17 PM  [facility] and [facility] Board certified by The [employer] of Sleep Medicine and Diplomate of the [employer] of [employer] Medicine. Board certified In Neurology through the ABPN, Fellow of the [employer] of Neu[employer]. Medical Director of [facility].

## 2019-05-22 NOTE — Patient Instructions (Signed)
Obesity Hypoventilation Syndrome  Obesity hypoventilation syndrome (OHS) means that you are not breathing well enough to get air in and out of your lungs efficiently (ventilation). This causes a low oxygen level and a high carbon dioxide level in your blood (hypoventilation). Having too much total body fat (obesity) is a significant risk factor for developing OHS. OHS makes it harder for your heart to pump oxygen-rich blood to your body. It can cause sleep disturbances and make you feel sleepy during the day. Over time, OHS can increase your risk for:  Heart disease.  High blood pressure (hypertension).  Reduced ability to absorb sugar from the bloodstream (insulin resistance).  Heart failure. Over time, OHS weakens your heart and can lead to heart failure. What are the causes? The exact cause of OHS is not known. Possible causes include:  Pressure on the lungs from excess body weight.  Obesity-related changes in how much air the lungs can hold (lung capacity) and how much they can expand (lung compliance).  Failure of the brain to regulate oxygen and carbon dioxide levels properly.  Chemicals (hormones) produced by excess fat cells interfering with breathing regulation.  A breathing condition in which breathing pauses or becomes shallow during sleep (sleep apnea). This condition can eventually cause the body to ventilate poorly and to hold onto carbon dioxide during the day. What increases the risk? You may have a greater risk for OHS if you:  Have a BMI of 30 or higher. BMI is an estimate of body fat that is calculated from height and weight. For adults, a BMI of 30 or higher is considered obese.  Are 40?40 years old.  Carry most of your excess weight around your waist.  Experience moderate symptoms of sleep apnea. What are the signs or symptoms? The most common symptoms of OHS are:  Daytime sleepiness.  Lack of energy.  Shortness of breath.  Morning headaches.  Sleep  apnea.  Trouble concentrating.  Irritability, mood swings, or depression.  Swollen veins in the neck.  Swelling of the legs. How is this diagnosed? Your health care provider may suspect OHS if you are obese and have poor breathing during the day and at night. Your health care provider will also do a physical exam. You may have tests to:  Measure your BMI.  Measure your blood oxygen level with a sensor placed on your finger (pulse oximetry).  Measure blood oxygen and carbon dioxide in a blood sample.  Measure the amount of red blood cells in a blood sample. OHS causes the number of red blood cells you have to increase (polycythemia).  Check your breathing ability (pulmonary function testing).  Check your breathing ability, breathing patterns, and oxygen level while you sleep (sleep study). You may also have a chest X-ray to rule out other breathing problems. You may have an electrocardiogram (ECG) and or echocardiogram to check for signs of heart failure. How is this treated? Weight loss is the most important part of treatment for OHS, and it may be the only treatment that you need. Other treatments may include:  Using a device to open your airway while you sleep, such as a continuous positive airway pressure (CPAP) machine that delivers oxygen to your airway through a mask.  Surgery (gastric bypass surgery) to lower your BMI. This may be needed if: ? You are very obese. ? Other treatments have not worked for you. ? Your OHS is very severe and is causing organ damage, such as heart failure. Follow these   instructions at home:  Medicines  Take over-the-counter and prescription medicines only as told by your health care provider.  Ask your health care provider what medicines are safe for you. You may be told to avoid medicines that can impair breathing and make OHS worse, such as sedatives and narcotics. Sleeping habits  If you are prescribed a CPAP machine, make sure you  understand and use the machine as directed.  Try to get 8 hours of sleep every night.  Go to bed at the same time every night, and get up at the same time every day. General instructions  Work with your health care provider to make a diet and exercise plan that helps you reach and maintain a healthy weight.  Eat a healthy diet.  Avoid smoking.  Exercise regularly as told by your health care provider.  During the evening, do not drink caffeine and do not eat heavy meals.  Keep all follow-up visits as told by your health care provider. This is important. Contact a health care provider if:  You experience new or worsening shortness of breath.  You have chest pain.  You have an irregular heartbeat (palpitations).  You have dizziness.  You faint.  You develop a cough.  You have a fever.  You have chest pain when you breathe (pleurisy). This information is not intended to replace advice given to you by your health care provider. Make sure you discuss any questions you have with your health care provider. Document Released: 11/29/2015 Document Revised: 10/11/2018 Document Reviewed: 11/29/2015 Elsevier Patient Education  2020 Elsevier Inc.  

## 2019-05-27 ENCOUNTER — Ambulatory Visit (INDEPENDENT_AMBULATORY_CARE_PROVIDER_SITE_OTHER): Payer: BC Managed Care – PPO | Admitting: Family Medicine

## 2019-05-27 ENCOUNTER — Other Ambulatory Visit: Payer: Self-pay

## 2019-05-27 ENCOUNTER — Encounter (INDEPENDENT_AMBULATORY_CARE_PROVIDER_SITE_OTHER): Payer: Self-pay | Admitting: Family Medicine

## 2019-05-27 VITALS — BP 138/83 | HR 87 | Temp 98.1°F | Ht 65.0 in | Wt >= 6400 oz

## 2019-05-27 DIAGNOSIS — E559 Vitamin D deficiency, unspecified: Secondary | ICD-10-CM

## 2019-05-27 DIAGNOSIS — E8881 Metabolic syndrome: Secondary | ICD-10-CM | POA: Diagnosis not present

## 2019-05-27 DIAGNOSIS — Z9189 Other specified personal risk factors, not elsewhere classified: Secondary | ICD-10-CM

## 2019-05-27 DIAGNOSIS — G4710 Hypersomnia, unspecified: Secondary | ICD-10-CM | POA: Diagnosis not present

## 2019-05-27 DIAGNOSIS — Z6841 Body Mass Index (BMI) 40.0 and over, adult: Secondary | ICD-10-CM

## 2019-05-27 MED ORDER — METFORMIN HCL 500 MG PO TABS: 500.0000 mg | ORAL_TABLET | Freq: Every day | ORAL | 0 refills | Status: DC

## 2019-05-27 MED ORDER — VITAMIN D (ERGOCALCIFEROL) 1.25 MG (50000 UNIT) PO CAPS: 50000.0000 [IU] | ORAL_CAPSULE | ORAL | 0 refills | Status: DC

## 2019-05-28 NOTE — Progress Notes (Signed)
Office: [phone]  /  Fax: [phone]   HPI:   Chief Complaint: OBESITY Elizabeth Escobar is here to discuss her progress with her obesity treatment plan. She is on the Category 4 plan and is following her eating plan approximately 75 % of the time. She states she is exercising 0 minutes 0 times per week. [name] stopped sodas and went to diet sodas. She is still craving them though. She had a sleep study appointment.  Her weight is (!) 423 lb (191.9 kg) today and has had a weight loss of 3 pounds over a period of 2 weeks since her last visit. She has lost 3 lbs since starting treatment with [place].  Vitamin D Deficiency Elizabeth Escobar has a diagnosis of vitamin D deficiency. She is not currently taking Vit D and denies nausea, vomiting or muscle weakness.  Insulin Resistance Elizabeth Escobar has a diagnosis of insulin resistance based on her elevated fasting insulin level >5. Although Elizabeth Escobar's blood glucose readings are still under good control, insulin resistance puts her at greater risk of metabolic syndrome and diabetes. She is not taking metformin currently and continues to work on diet and exercise to decrease risk of diabetes.  At risk for diabetes Elizabeth Escobar is at higher than average risk for developing diabetes due to her obesity and insulin resistance. She currently denies polyuria or polydipsia.  Hypersomnia Elizabeth Escobar has hypersomnia. She had a sleep study recently.  ASSESSMENT AND PLAN:  Vitamin D deficiency - Plan: Vitamin D, Ergocalciferol, (DRISDOL) 1.25 MG (50000 UT) CAPS capsule  Insulin resistance - Plan: metFORMIN (GLUCOPHAGE) 500 MG tablet  Hypersomnia  At risk for diabetes mellitus  Class 3 severe obesity with serious comorbidity and body mass index (BMI) greater than or equal to 70 in adult, unspecified obesity type (HCC)  PLAN:  Vitamin D Deficiency Elizabeth Escobar was informed that low vitamin D levels contributes to fatigue and are associated with obesity, breast, and colon cancer.  Elizabeth Escobar agrees to start prescription Vit D 50,000 IU every week #4 with no refills. She will follow up for routine testing of vitamin D, at least 2-3 times per year. She was informed of the risk of over-replacement of vitamin D and agrees to not increase her dose unless she discusses this with [place] first. Elizabeth Escobar agrees to follow up with our clinic in 2 weeks.  Insulin Resistance Elizabeth Escobar will continue to work on weight loss, exercise, and decreasing simple carbohydrates in her diet to help decrease the risk of diabetes. We dicussed metformin including benefits and risks. She was informed that eating too many simple carbohydrates or too many calories at one sitting increases the likelihood of GI side effects. Elizabeth Escobar agrees to start metformin 500 mg PO daily #30 with no refills. Elizabeth Escobar agrees to follow up with our clinic in 2 weeks as directed to monitor her progress.  Diabetes risk counseling Elizabeth Escobar was given extended (15 minutes) diabetes prevention counseling today. She is 38 y.o. female and has risk factors for diabetes including obesity and insulin resistance. We discussed intensive lifestyle modifications today with an emphasis on weight loss as well as increasing exercise and decreasing simple carbohydrates in her diet.  Hypersomnia Elizabeth Escobar's sleep study results are pending. We will follow up.  Obesity Elizabeth Escobar is currently in the action stage of change. As such, her goal is to continue with weight loss efforts She has agreed to follow the Category 4 plan Elizabeth Escobar has been instructed to work up to a goal of 150 minutes of combined cardio and strengthening exercise  per week or as tolerated for weight loss and overall health benefits. We discussed the following Behavioral Modification Strategies today: increasing lean protein intake, decreasing simple carbohydrates, increasing vegetables, increase H20 intake, work on meal planning and easy cooking plans, holiday eating strategies  and decrease  liquid calories   Elizabeth Escobar has agreed to follow up with our clinic in 2 weeks. She was informed of the importance of frequent follow up visits to maximize her success with intensive lifestyle modifications for her multiple health conditions.  ALLERGIES: No Known Allergies  MEDICATIONS: Current Outpatient Medications on File Prior to Visit  Medication Sig Dispense Refill  . cetirizine (ZYRTEC) 10 MG tablet Take 10 mg by mouth every other day.     . ibuprofen (ADVIL) 200 MG tablet Take 200 mg by mouth every 6 (six) hours as needed.    . medroxyPROGESTERone (DEPO-PROVERA) 150 MG/ML injection Inject 150 mg into the muscle every 3 (three) months.    . Multiple Vitamins-Minerals (MULTIVITAMIN ADULT PO) Take by mouth.     No current facility-administered medications on file prior to visit.     PAST MEDICAL HISTORY: Past Medical History:  Diagnosis Date  . Anemia   . Back pain   . Knee pain   . Obesity     PAST SURGICAL HISTORY: History reviewed. No pertinent surgical history.  SOCIAL HISTORY: Social History   Tobacco Use  . Smoking status: Never Smoker  . Smokeless tobacco: Never Used  Substance Use Topics  . Alcohol use: No  . Drug use: No    FAMILY HISTORY: Family History  Problem Relation Age of Onset  . Cancer Mother   . Obesity Mother   . Lupus Sister     ROS: Review of Systems  Constitutional: Positive for weight loss.  Gastrointestinal: Negative for nausea and vomiting.  Genitourinary: Negative for frequency.  Musculoskeletal:       Negative muscle weakness  Endo/Heme/Allergies: Negative for polydipsia.    PHYSICAL EXAM: Blood pressure 138/83, pulse 87, temperature 98.1 F (36.7 C), temperature source Oral, height 5\' 5"  (1.651 m), weight (!) 423 lb (191.9 kg), SpO2 98 %. Body mass index is 70.39 kg/m. Physical Exam Vitals signs reviewed.  Constitutional:      Appearance: Normal appearance. She is obese.  Cardiovascular:     Rate and Rhythm: Normal  rate.     Pulses: Normal pulses.  Pulmonary:     Effort: Pulmonary effort is normal.     Breath sounds: Normal breath sounds.  Musculoskeletal: Normal range of motion.  Skin:    General: Skin is warm and dry.  Neurological:     Mental Status: She is alert and oriented to person, place, and time.  Psychiatric:        Mood and Affect: Mood normal.        Behavior: Behavior normal.     RECENT LABS AND TESTS: BMET    Component Value Date/Time   NA 139 05/14/2019 1130   K 4.1 05/14/2019 1130   CL 101 05/14/2019 1130   CO2 25 05/14/2019 1130   GLUCOSE 81 05/14/2019 1130   BUN 12 05/14/2019 1130   CREATININE 0.68 05/14/2019 1130   CALCIUM 9.2 05/14/2019 1130   GFRNONAA 111 05/14/2019 1130   GFRAA 127 05/14/2019 1130   Lab Results  Component Value Date   HGBA1C 5.3 05/14/2019   Lab Results  Component Value Date   INSULIN 18.4 05/14/2019   CBC    Component Value Date/Time  WBC 7.7 05/14/2019 1130   RBC 4.51 05/14/2019 1130   HGB 12.2 05/14/2019 1130   HCT 38.2 05/14/2019 1130   PLT 279 05/14/2019 1130   MCV 85 05/14/2019 1130   MCH 27.1 05/14/2019 1130   MCHC 31.9 05/14/2019 1130   RDW 12.7 05/14/2019 1130   LYMPHSABS 3.2 (H) 05/14/2019 1130   EOSABS 0.1 05/14/2019 1130   BASOSABS 0.0 05/14/2019 1130   Iron/TIBC/Ferritin/ %Sat No results found for: IRON, TIBC, FERRITIN, IRONPCTSAT Lipid Panel     Component Value Date/Time   CHOL 161 05/14/2019 1130   TRIG 44 05/14/2019 1130   HDL 43 05/14/2019 1130   LDLCALC 109 (H) 05/14/2019 1130   Hepatic Function Panel     Component Value Date/Time   PROT 6.9 05/14/2019 1130   ALBUMIN 4.1 05/14/2019 1130   AST 15 05/14/2019 1130   ALT 14 05/14/2019 1130   ALKPHOS 62 05/14/2019 1130   BILITOT 0.4 05/14/2019 1130      Component Value Date/Time   TSH 2.540 05/14/2019 1130      OBESITY BEHAVIORAL INTERVENTION VISIT  Today's visit was # 2   Starting weight: 426 lbs Starting date: 05/14/2019 Today's weight  : 423 lbs Today's date: 05/27/2019 Total lbs lost to date: 3    ASK: We discussed the diagnosis of obesity with Mikeala Spainhour today and Amiaya agreed to give us permission to discuss obesity behavioral modification therapy today.  ASSESS: Jasmon has the diagnosis of obesity and her BMI today is 70.39 Elysa is in the action stage of change   ADVISE: Purvi was educated on the multiple health risks of obesity as well as the benefit of weight loss to improve her health. She was advised of the need for long term treatment and the importance of lifestyle modifications to improve her current health and to decrease her risk of future health problems.  AGREE: Multiple dietary modification options and treatment options were discussed and  Skyley agreed to follow the recommendations documented in the above note.  ARRANGE: Terralyn was educated on the importance of frequent visits to treat obesity as outlined per CMS and USPSTF guidelines and agreed to schedule her next follow up appointment today.  Trude McburneyI, Sharon Martin, am acting as transcriptionist for Helane RimaErica Aerith Canal, DO  I have reviewed the above documentation for accuracy and completeness, and I agree with the above. Helane Rima- Gracelynn Bircher, DO

## 2019-06-02 ENCOUNTER — Encounter (INDEPENDENT_AMBULATORY_CARE_PROVIDER_SITE_OTHER): Payer: Self-pay | Admitting: Family Medicine

## 2019-06-10 ENCOUNTER — Encounter (INDEPENDENT_AMBULATORY_CARE_PROVIDER_SITE_OTHER): Payer: Self-pay

## 2019-06-11 ENCOUNTER — Encounter (INDEPENDENT_AMBULATORY_CARE_PROVIDER_SITE_OTHER): Payer: Self-pay | Admitting: Family Medicine

## 2019-06-11 ENCOUNTER — Ambulatory Visit (INDEPENDENT_AMBULATORY_CARE_PROVIDER_SITE_OTHER): Payer: BC Managed Care – PPO | Admitting: Family Medicine

## 2019-06-11 ENCOUNTER — Other Ambulatory Visit: Payer: Self-pay

## 2019-06-11 VITALS — BP 146/87 | HR 68 | Temp 98.4°F | Ht 65.0 in | Wt >= 6400 oz

## 2019-06-11 DIAGNOSIS — Z9189 Other specified personal risk factors, not elsewhere classified: Secondary | ICD-10-CM

## 2019-06-11 DIAGNOSIS — Z6841 Body Mass Index (BMI) 40.0 and over, adult: Secondary | ICD-10-CM

## 2019-06-11 DIAGNOSIS — E559 Vitamin D deficiency, unspecified: Secondary | ICD-10-CM | POA: Diagnosis not present

## 2019-06-11 DIAGNOSIS — E8881 Metabolic syndrome: Secondary | ICD-10-CM | POA: Diagnosis not present

## 2019-06-11 DIAGNOSIS — G4719 Other hypersomnia: Secondary | ICD-10-CM

## 2019-06-11 MED ORDER — METFORMIN HCL 500 MG PO TABS: 500.0000 mg | ORAL_TABLET | Freq: Every day | ORAL | 0 refills | Status: DC

## 2019-06-11 MED ORDER — VITAMIN D (ERGOCALCIFEROL) 1.25 MG (50000 UNIT) PO CAPS: 50000.0000 [IU] | ORAL_CAPSULE | ORAL | 0 refills | Status: DC

## 2019-06-12 NOTE — Progress Notes (Signed)
Office: [phone]  /  Fax: [phone]   HPI:  Chief Complaint: OBESITY Elizabeth Escobar is here to discuss her progress with her obesity treatment plan. She is on the Category 4 plan and states she is following her eating plan approximately 90 % of the time. She states she is exercising 0 minutes 0 times per week.  Markasia's son is back in town from college. He is bringing in junk food. She is sticking to the plan most of the time and she is tolerating metformin.  Vitamin D Deficiency Elizabeth Escobar is taking prescription Vit D.  Insulin Resistance Elizabeth Escobar is taking metformin currently.   At risk for diabetes Elizabeth Escobar is at higher than average risk for developing diabetes due to her obesity.   Excessive Daytime Sleepiness Elizabeth Escobar's symptoms are stable to slightly improved (negative sleep study).  Today's visit was # 3  Starting weight: 426 lbs Starting date: 05/14/2019 Today's weight : 416 lbs Today's date: 06/11/2019 Total lbs lost to date: 10 Total lbs lost since last in-office visit: 7  ASSESSMENT AND PLAN:  Vitamin D deficiency - Plan: Vitamin D, Ergocalciferol, (DRISDOL) 1.25 MG (50000 UT) CAPS capsule  Insulin resistance - Plan: metFORMIN (GLUCOPHAGE) 500 MG tablet  Excessive daytime sleepiness  At risk for diabetes mellitus  Class 3 severe obesity with serious comorbidity and body mass index (BMI) of 60.0 to 69.9 in adult, unspecified obesity type (HCC)  PLAN:  Vitamin D Deficiency Low vitamin D level contributes to fatigue and are associated with obesity, breast, and colon cancer. Beckey agrees to continue taking prescription Vit D 50,000 IU every week #4 and we will refill for 1 month. She will follow up for routine testing of vitamin D, at least 2-3 times per year to avoid over-replacement. Shekita agrees to follow up with our clinic in 2 weeks.  Insulin Resistance Exie will continue to work on weight loss, exercise, and decreasing simple carbohydrates to help  decrease the risk of diabetes. [name] agrees to continue taking metformin 500 mg PO daily #30 and we will refill for 1 month. Clotile agrees to follow up with our clinic in 2 weeks as directed to closely monitor her progress.  Diabetes risk counseling (~15 min) [name] is a 38 y.o. female and has risk factors for diabetes including obesity. We discussed intensive lifestyle modifications today with an emphasis on weight loss as well as increasing exercise and decreasing simple carbohydrates in her diet.  Excessive Daytime Sleepiness We will continue to monitor.  Obesity Elizabeth Escobar is currently in the action stage of change. As such, her goal is to continue with weight loss efforts She has agreed to follow the Category 4 plan. Elizabeth Escobar has been instructed to work up to a goal of 150 minutes of combined cardio and strengthening exercise per week or as tolerated for weight loss and overall health benefits.  We discussed the following Behavioral Modification Strategies today: increasing lean protein intake, decreasing simple carbohydrates, increasing vegetables, increase H20 intake, keeping healthy foods in the home, and holiday eating strategies   Elizabeth Escobar has agreed to follow up with our clinic in 2 weeks. She was informed of the importance of frequent follow up visits to maximize her success with intensive lifestyle modifications for her multiple health conditions.  ALLERGIES: No Known Allergies  MEDICATIONS: Current Outpatient Medications on File Prior to Visit  Medication Sig Dispense Refill  . cetirizine (ZYRTEC) 10 MG tablet Take 10 mg by mouth every other day.     . ibuprofen (ADVIL) 200  MG tablet Take 200 mg by mouth every 6 (six) hours as needed.    . medroxyPROGESTERone (DEPO-PROVERA) 150 MG/ML injection Inject 150 mg into the muscle every 3 (three) months.    . Multiple Vitamins-Minerals (MULTIVITAMIN ADULT PO) Take by mouth.     No current facility-administered medications on file  prior to visit.   PAST MEDICAL HISTORY: Past Medical History:  Diagnosis Date  . Anemia   . Back pain   . Knee pain   . Obesity    PAST SURGICAL HISTORY: History reviewed. No pertinent surgical history.  SOCIAL HISTORY: Social History   Tobacco Use  . Smoking status: Never Smoker  . Smokeless tobacco: Never Used  Substance Use Topics  . Alcohol use: No  . Drug use: No   FAMILY HISTORY: Family History  Problem Relation Age of Onset  . Cancer Mother   . Obesity Mother   . Lupus Sister    ROS: Review of Systems  Constitutional: Positive for weight loss.  Genitourinary: Negative for frequency.  Endo/Heme/Allergies: Negative for polydipsia.   PHYSICAL EXAM: Blood pressure (!) 146/87, pulse 68, temperature 98.4 F (36.9 C), temperature source Oral, height 5\' 5"  (1.651 m), weight (!) 416 lb (188.7 kg), SpO2 96 %. Body mass index is 69.23 kg/m.   Physical Exam Vitals reviewed.  Constitutional:      Appearance: Normal appearance. She is obese.  Cardiovascular:     Rate and Rhythm: Normal rate.     Pulses: Normal pulses.  Pulmonary:     Effort: Pulmonary effort is normal.     Breath sounds: Normal breath sounds.  Musculoskeletal:        General: Normal range of motion.  Skin:    General: Skin is warm and dry.  Neurological:     Mental Status: She is alert and oriented to person, place, and time.  Psychiatric:        Mood and Affect: Mood normal.        Behavior: Behavior normal.     RECENT LABS AND TESTS: BMET    Component Value Date/Time   NA 139 05/14/2019 1130   K 4.1 05/14/2019 1130   CL 101 05/14/2019 1130   CO2 25 05/14/2019 1130   GLUCOSE 81 05/14/2019 1130   BUN 12 05/14/2019 1130   CREATININE 0.68 05/14/2019 1130   CALCIUM 9.2 05/14/2019 1130   GFRNONAA 111 05/14/2019 1130   GFRAA 127 05/14/2019 1130   Lab Results  Component Value Date   HGBA1C 5.3 05/14/2019   Lab Results  Component Value Date   INSULIN 18.4 05/14/2019   CBC      Component Value Date/Time   WBC 7.7 05/14/2019 1130   RBC 4.51 05/14/2019 1130   HGB 12.2 05/14/2019 1130   HCT 38.2 05/14/2019 1130   PLT 279 05/14/2019 1130   MCV 85 05/14/2019 1130   MCH 27.1 05/14/2019 1130   MCHC 31.9 05/14/2019 1130   RDW 12.7 05/14/2019 1130   LYMPHSABS 3.2 (H) 05/14/2019 1130   EOSABS 0.1 05/14/2019 1130   BASOSABS 0.0 05/14/2019 1130   Iron/TIBC/Ferritin/ %Sat No results found for: IRON, TIBC, FERRITIN, IRONPCTSAT Lipid Panel     Component Value Date/Time   CHOL 161 05/14/2019 1130   TRIG 44 05/14/2019 1130   HDL 43 05/14/2019 1130   LDLCALC 109 (H) 05/14/2019 1130   Hepatic Function Panel     Component Value Date/Time   PROT 6.9 05/14/2019 1130   ALBUMIN 4.1 05/14/2019 1130  AST 15 05/14/2019 1130   ALT 14 05/14/2019 1130   ALKPHOS 62 05/14/2019 1130   BILITOT 0.4 05/14/2019 1130      Component Value Date/Time   TSH 2.540 05/14/2019 1130    I, Burt Knack, am acting as transcriptionist for Helane Rima, DO  I have reviewed the above documentation for accuracy and completeness, and I agree with the above. Helane Rima, DO

## 2019-06-12 NOTE — Progress Notes (Deleted)
Office: [phone]  /  Fax: [phone]   HPI:   Chief Complaint: OBESITY Elizabeth Escobar is here to discuss her progress with her obesity treatment plan. She is on the  {MWMwtlossportion/plan#2:210800006} and is following her eating plan approximately *** % of the time. She states she is exercising *** minutes *** times per week. Elizabeth Escobar is currently struggling with ***.  Her weight is (!) 416 lb (188.7 kg) today and {misc; weight loss:12477} since her last visit. She has lost *** lbs since starting treatment with [place].  Vitamin D Deficiency Elizabeth Escobar has a diagnosis of vitamin D deficiency. She {ACTION; IS/IS SWN:46270350} currently taking vit D and denies nausea, vomiting or muscle weakness.  Insulin Resistance Elizabeth Escobar has a diagnosis of insulin resistance based on her elevated fasting insulin level >5. Although Elizabeth Escobar's blood glucose readings are still under good control, insulin resistance puts her at greater risk of metabolic syndrome and diabetes. She {ACTION; IS/IS KXF:81829937} taking metformin currently and continues to work on diet and exercise to decrease risk of diabetes.  At risk for diabetes Elizabeth Escobar is at higher than average risk for developing diabetes due to her obesity and insulin resistance.   Excessive Daytime Sleepiness     ASSESSMENT AND PLAN:  Vitamin D deficiency - Plan: Vitamin D, Ergocalciferol, (DRISDOL) 1.25 MG (50000 UT) CAPS capsule  Insulin resistance - Plan: metFORMIN (GLUCOPHAGE) 500 MG tablet  Excessive daytime sleepiness  At risk for diabetes mellitus  Class 3 severe obesity with serious comorbidity and body mass index (BMI) of 60.0 to 69.9 in adult, unspecified obesity type (HCC)  PLAN:  Vitamin D Deficiency Low vitamin D level contributes to fatigue and are associated with obesity, breast, and colon cancer. She agrees to continue to take prescription Vit D 50,000 IU every week and will follow up for routine testing of vitamin D, at least 2-3  times per year to avoid over-replacement.  Insulin Resistance Elizabeth Escobar will continue to work on weight loss, exercise, and decreasing simple carbohydrates to help decrease the risk of diabetes. Elizabeth Escobar agreed to follow up with [place] as directed to closely monitor her progress.  Diabetes risk counseling (~15 min) Elizabeth Escobar is a 38 y.o. female and has risk factors for diabetes including obesity and insulin resistance. We discussed intensive lifestyle modifications today with an emphasis on weight loss as well as increasing exercise and decreasing simple carbohydrates in her diet.  Excessive Daytime Sleepiness     Obesity Elizabeth Escobar {CHL AMB IS/IS NOT:210130109} currently in the action stage of change. As such, her goal is to {MWMwtloss#1:210800005} She has agreed to {MWMwtlossportion/plan#2:210800006} [name] has been instructed to work up to a goal of 150 minutes of combined cardio and strengthening exercise per week or as tolerated for weight loss and overall health benefits. We discussed the following Behavioral Modification Strategies today: {MWMwtlossdietstrategies#3:210800007}   Elizabeth Escobar has agreed to follow up with our clinic in {NUMBER 1-10:22536} weeks. She was informed of the importance of frequent follow up visits to maximize her success with intensive lifestyle modifications for her multiple health conditions.  ALLERGIES: No Known Allergies  MEDICATIONS: Current Outpatient Medications on File Prior to Visit  Medication Sig Dispense Refill  . cetirizine (ZYRTEC) 10 MG tablet Take 10 mg by mouth every other day.     . ibuprofen (ADVIL) 200 MG tablet Take 200 mg by mouth every 6 (six) hours as needed.    . medroxyPROGESTERone (DEPO-PROVERA) 150 MG/ML injection Inject 150 mg into the muscle every 3 (three) months.    Marland Kitchen  Multiple Vitamins-Minerals (MULTIVITAMIN ADULT PO) Take by mouth.     No current facility-administered medications on file prior to visit.    PAST MEDICAL HISTORY:  Past Medical History:  Diagnosis Date  . Anemia   . Back pain   . Knee pain   . Obesity     PAST SURGICAL HISTORY: History reviewed. No pertinent surgical history.  SOCIAL HISTORY: Social History   Tobacco Use  . Smoking status: Never Smoker  . Smokeless tobacco: Never Used  Substance Use Topics  . Alcohol use: No  . Drug use: No    FAMILY HISTORY: Family History  Problem Relation Age of Onset  . Cancer Mother   . Obesity Mother   . Lupus Sister     ROS: Review of Systems  Constitutional: Positive for weight loss.  Gastrointestinal: Negative for nausea and vomiting.  Genitourinary: Negative for frequency.  Musculoskeletal:       Negative muscle weakness  Endo/Heme/Allergies: Negative for polydipsia.    PHYSICAL EXAM: Blood pressure (!) 146/87, pulse 68, temperature 98.4 F (36.9 C), temperature source Oral, height 5\' 5"  (1.651 m), weight (!) 416 lb (188.7 kg), SpO2 96 %. Body mass index is 69.23 kg/m. Physical Exam Vitals reviewed.  Constitutional:      Appearance: Normal appearance. She is obese.  Cardiovascular:     Rate and Rhythm: Normal rate.     Pulses: Normal pulses.  Pulmonary:     Effort: Pulmonary effort is normal.     Breath sounds: Normal breath sounds.  Musculoskeletal:        General: Normal range of motion.  Skin:    General: Skin is warm and dry.  Neurological:     Mental Status: She is alert and oriented to person, place, and time.  Psychiatric:        Mood and Affect: Mood normal.        Behavior: Behavior normal.     RECENT LABS AND TESTS: BMET    Component Value Date/Time   NA 139 05/14/2019 1130   K 4.1 05/14/2019 1130   CL 101 05/14/2019 1130   CO2 25 05/14/2019 1130   GLUCOSE 81 05/14/2019 1130   BUN 12 05/14/2019 1130   CREATININE 0.68 05/14/2019 1130   CALCIUM 9.2 05/14/2019 1130   GFRNONAA 111 05/14/2019 1130   GFRAA 127 05/14/2019 1130   Lab Results  Component Value Date   HGBA1C 5.3 05/14/2019   Lab  Results  Component Value Date   INSULIN 18.4 05/14/2019   CBC    Component Value Date/Time   WBC 7.7 05/14/2019 1130   RBC 4.51 05/14/2019 1130   HGB 12.2 05/14/2019 1130   HCT 38.2 05/14/2019 1130   PLT 279 05/14/2019 1130   MCV 85 05/14/2019 1130   MCH 27.1 05/14/2019 1130   MCHC 31.9 05/14/2019 1130   RDW 12.7 05/14/2019 1130   LYMPHSABS 3.2 (H) 05/14/2019 1130   EOSABS 0.1 05/14/2019 1130   BASOSABS 0.0 05/14/2019 1130   Iron/TIBC/Ferritin/ %Sat No results found for: IRON, TIBC, FERRITIN, IRONPCTSAT Lipid Panel     Component Value Date/Time   CHOL 161 05/14/2019 1130   TRIG 44 05/14/2019 1130   HDL 43 05/14/2019 1130   LDLCALC 109 (H) 05/14/2019 1130   Hepatic Function Panel     Component Value Date/Time   PROT 6.9 05/14/2019 1130   ALBUMIN 4.1 05/14/2019 1130   AST 15 05/14/2019 1130   ALT 14 05/14/2019 1130   ALKPHOS 62 05/14/2019  1130   BILITOT 0.4 05/14/2019 1130      Component Value Date/Time   TSH 2.540 05/14/2019 1130      OBESITY BEHAVIORAL INTERVENTION VISIT  Today's visit was # {Numbers; 3-66:29476}   Starting weight: *** Starting date: *** Today's weight : 416 lbs Today's date: 06/11/2019 Total lbs lost to date: ***    ASK: We discussed the diagnosis of obesity with Elizabeth Escobar today and Elizabeth Escobar agreed to give [place] permission to discuss obesity behavioral modification therapy today.  ASSESS: Elizabeth Escobar has the diagnosis of obesity and her BMI today is @TBMI @ Elizabeth Escobar {ACTION; IS/IS NOT:21021397} in the action stage of change   ADVISE: Elizabeth Escobar was educated on the multiple health risks of obesity as well as the benefit of weight loss to improve her health. She was advised of the need for long term treatment and the importance of lifestyle modifications to improve her current health and to decrease her risk of future health problems.  AGREE: Multiple dietary modification options and treatment options were discussed and  Elizabeth Escobar agreed to  follow the recommendations documented in the above note.  ARRANGE: Elizabeth Escobar was educated on the importance of frequent visits to treat obesity as outlined per CMS and USPSTF guidelines and agreed to schedule her next follow up appointment today.  I, ***, am acting as transcriptionist for Briscoe Deutscher, DO

## 2019-06-16 ENCOUNTER — Encounter (INDEPENDENT_AMBULATORY_CARE_PROVIDER_SITE_OTHER): Payer: Self-pay | Admitting: Family Medicine

## 2019-06-16 ENCOUNTER — Telehealth: Payer: Self-pay

## 2019-06-16 NOTE — Telephone Encounter (Signed)
We have attempted to call the patient two times to schedule sleep study.  Patient has been unavailable at the phone numbers we have on file and has not returned our calls. If patient calls back we will schedule them for their sleep study.  

## 2019-06-25 ENCOUNTER — Encounter (INDEPENDENT_AMBULATORY_CARE_PROVIDER_SITE_OTHER): Payer: Self-pay | Admitting: Family Medicine

## 2019-06-25 ENCOUNTER — Other Ambulatory Visit: Payer: Self-pay

## 2019-06-25 ENCOUNTER — Ambulatory Visit (INDEPENDENT_AMBULATORY_CARE_PROVIDER_SITE_OTHER): Payer: BC Managed Care – PPO | Admitting: Family Medicine

## 2019-06-25 VITALS — BP 162/83 | HR 65 | Temp 97.9°F | Ht 65.0 in | Wt >= 6400 oz

## 2019-06-25 DIAGNOSIS — E8881 Metabolic syndrome: Secondary | ICD-10-CM

## 2019-06-25 DIAGNOSIS — Z9189 Other specified personal risk factors, not elsewhere classified: Secondary | ICD-10-CM | POA: Diagnosis not present

## 2019-06-25 DIAGNOSIS — Z6841 Body Mass Index (BMI) 40.0 and over, adult: Secondary | ICD-10-CM

## 2019-06-25 DIAGNOSIS — E559 Vitamin D deficiency, unspecified: Secondary | ICD-10-CM

## 2019-06-25 DIAGNOSIS — R030 Elevated blood-pressure reading, without diagnosis of hypertension: Secondary | ICD-10-CM | POA: Diagnosis not present

## 2019-07-01 ENCOUNTER — Encounter (INDEPENDENT_AMBULATORY_CARE_PROVIDER_SITE_OTHER): Payer: Self-pay | Admitting: Family Medicine

## 2019-07-01 NOTE — Progress Notes (Signed)
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 Elizabeth Escobar is on the Category 4 plan and states she is following her eating plan approximately 85 % of the time. Elizabeth Escobar states she is exercising 0 minutes 0 times per week.  Today's visit was # 4  Starting weight: 426 lbs Starting date: 05/14/2019 Today's weight : 415 lbs Today's date: 06/25/2019 Total lbs lost to date: 11 Total lbs lost since last in-office visit: 1  Subjective:   Interim History: Elizabeth Escobar notes she increased sweet tea and is eating later.  1. Vitamin D deficiency Elizabeth Escobar is currently taking Vit D. Last Vit D level was 30.5.  2. Insulin resistance Elizabeth Escobar's last insulin level was 18.4. She is taking metformin, and notes abdominal cramping.  3. Elevated blood pressure reading Elizabeth Escobar's blood pressure is elevated today at 162/83. She denies headaches, chest pain, or edema.  4. At risk for diabetes Elizabeth Escobar is at higher than average risk for developing diabetes due to her obesity.   Reviewed and updated this visit by clinician and staff: allergies, medications, problem list, medical history, surgical history, family history, social history and previous encounter notes.  General review of systems is unchanged or negative, with the exception of new weight loss and abdominal cramping.  Assessment:   1. Vitamin D deficiency   2. Insulin resistance   3. Elevated blood pressure reading   4. At risk for diabetes mellitus   5. Class 3 severe obesity with serious comorbidity and body mass index (BMI) of 60.0 to 69.9 in adult, unspecified obesity type (HCC)    Plan:   1. Vitamin D deficiency Low Vitamin D level contributes to fatigue and are associated with obesity, breast, and colon cancer. Elizabeth Escobar agrees to continue taking prescription Vitamin D 50,000 IU every week, no refill needed. She will follow-up for routine testing of vitamin D, at least  2-3 times per year to avoid over-replacement. Elizabeth Escobar agrees to follow up with [place] as directed to monitor her progress.  2. Insulin resistance Gunhild will continue to work on weight loss, exercise, and decreasing simple carbohydrates to help decrease the risk of diabetes. She notes metformin caused abdominal cramping, so she will discontinue. Elizabeth Escobar agrees to follow-up with [place] as directed to closely monitor her progress.  3. Elevated blood pressure reading We will continue to monitor closely.  4. Diabetes risk counseling (~15 min) Elizabeth Escobar is a 38 y.o. female and has risk factors for diabetes including obesity. We discussed intensive lifestyle modifications today with an emphasis on weight loss as well as increasing exercise and decreasing simple carbohydrates in her diet.  5. Class 3 severe obesity with serious comorbidity and body mass index (BMI) of 60.0 to 69.9 in adult, unspecified obesity type (HCC) Elizabeth Escobar is currently in the action stage of change. As such, her goal is to continue with weight loss efforts. She has agreed to follow the Category 4 plan. The exercise goal is to eventually work up to 150 minutes of combined cardio and strengthening exercise per week for weight loss and overall health benefits. We discussed the following Behavioral Modification Strategies today: increasing lean protein intake, increasing water intake, decreasing liquid calories and dealing with family or coworker sabotage.  Elizabeth Escobar has agreed to follow-up with our clinic in 3 weeks. She was informed of the importance of frequent follow-up visits to maximize her success with intensive lifestyle modifications for her multiple health conditions.  Objective:  Blood pressure (!) 162/83, pulse 65, temperature 97.9 F (36.6 C), temperature source Oral, height 5\' 5"  (1.651 m), weight (!) 415 lb (188.2 kg), SpO2 99 %. Body mass index is 69.06 kg/m.  General: Cooperative, alert, well developed, in no acute  distress. HEENT: Conjunctivae and lids unremarkable. Neck: No thyromegaly.  Cardiovascular: Regular rhythm.  Lungs: Normal work of breathing. Extremities: No edema.  Neurologic: No focal deficits.   Lab Results  Component Value Date   CREATININE 0.68 05/14/2019   BUN 12 05/14/2019   NA 139 05/14/2019   K 4.1 05/14/2019   CL 101 05/14/2019   CO2 25 05/14/2019   Lab Results  Component Value Date   ALT 14 05/14/2019   AST 15 05/14/2019   ALKPHOS 62 05/14/2019   BILITOT 0.4 05/14/2019   Lab Results  Component Value Date   HGBA1C 5.3 05/14/2019   Lab Results  Component Value Date   INSULIN 18.4 05/14/2019   Lab Results  Component Value Date   TSH 2.540 05/14/2019   Lab Results  Component Value Date   CHOL 161 05/14/2019   HDL 43 05/14/2019   LDLCALC 109 (H) 05/14/2019   TRIG 44 05/14/2019   Lab Results  Component Value Date   WBC 7.7 05/14/2019   HGB 12.2 05/14/2019   HCT 38.2 05/14/2019   MCV 85 05/14/2019   PLT 279 05/14/2019   No results found for: IRON, TIBC, FERRITIN  Attestation Statements:   Wilhemena Durie, am acting as transcriptionist for [employer], DO.  I have reviewed the above documentation for accuracy and completeness, and I agree with the above. Briscoe Deutscher, DO

## 2019-07-16 DIAGNOSIS — M654 Radial styloid tenosynovitis [de Quervain]: Secondary | ICD-10-CM | POA: Insufficient documentation

## 2019-07-16 DIAGNOSIS — M25531 Pain in right wrist: Secondary | ICD-10-CM | POA: Diagnosis not present

## 2019-07-17 ENCOUNTER — Ambulatory Visit (INDEPENDENT_AMBULATORY_CARE_PROVIDER_SITE_OTHER): Payer: BC Managed Care – PPO | Admitting: Family Medicine

## 2019-07-17 ENCOUNTER — Other Ambulatory Visit: Payer: Self-pay

## 2019-07-17 ENCOUNTER — Encounter (INDEPENDENT_AMBULATORY_CARE_PROVIDER_SITE_OTHER): Payer: Self-pay | Admitting: Family Medicine

## 2019-07-17 VITALS — BP 133/82 | HR 82 | Temp 98.6°F | Ht 65.0 in | Wt >= 6400 oz

## 2019-07-17 DIAGNOSIS — E559 Vitamin D deficiency, unspecified: Secondary | ICD-10-CM | POA: Diagnosis not present

## 2019-07-17 DIAGNOSIS — E8881 Metabolic syndrome: Secondary | ICD-10-CM | POA: Diagnosis not present

## 2019-07-17 DIAGNOSIS — Z9189 Other specified personal risk factors, not elsewhere classified: Secondary | ICD-10-CM | POA: Diagnosis not present

## 2019-07-17 DIAGNOSIS — R030 Elevated blood-pressure reading, without diagnosis of hypertension: Secondary | ICD-10-CM

## 2019-07-17 DIAGNOSIS — Z6841 Body Mass Index (BMI) 40.0 and over, adult: Secondary | ICD-10-CM

## 2019-07-17 MED ORDER — VITAMIN D (ERGOCALCIFEROL) 1.25 MG (50000 UNIT) PO CAPS: 50000.0000 [IU] | ORAL_CAPSULE | ORAL | 0 refills | Status: DC

## 2019-07-21 ENCOUNTER — Encounter (INDEPENDENT_AMBULATORY_CARE_PROVIDER_SITE_OTHER): Payer: Self-pay | Admitting: Family Medicine

## 2019-07-21 NOTE — Progress Notes (Signed)
Chief Complaint:   OBESITY Elizabeth Escobar is here to discuss her progress with her obesity treatment plan along with follow-up of her obesity related diagnoses. Elizabeth Escobar is on the Category 4 Plan and states she is following her eating plan approximately 75% of the time. Elizabeth Escobar states she is doing 0 minutes 0 times per week.  Today's visit was #: 5 Starting weight: 426 lbs Starting date: 05/14/2019 Today's weight: 412 lbs Today's date: 07/17/2019 Total lbs lost to date: 14 Total lbs lost since last in-office visit: 3  Interim History: Elizabeth Escobar went back to eating after she gets home from work, and she is getting good sleep.  Subjective:   1. Insulin resistance Elizabeth Escobar has a diagnosis of insulin resistance based on her elevated fasting insulin level >5. She continues to work on diet and exercise to decrease her risk of diabetes.  Lab Results  Component Value Date   INSULIN 18.4 05/14/2019   Lab Results  Component Value Date   HGBA1C 5.3 05/14/2019   2. Vitamin D deficiency Elizabeth Escobar's Vitamin D level was 30.5 on 05/14/2019. She is currently taking vit D. She denies nausea, vomiting or muscle weakness.  3. Elevated blood pressure reading Elizabeth Escobar's blood pressure has improved to 133/82. Cardiovascular ROS: no chest pain or dyspnea on exertion.  BP Readings from Last 3 Encounters:  07/17/19 133/82  06/25/19 (!) 162/83  06/11/19 (!) 146/87   4. At risk for hypertension The patient is at a higher than average risk of hypertension due to elevated blood pressure.  Assessment/Plan:   1. Insulin resistance Kaysi will continue to work on weight loss, exercise, and decreasing simple carbohydrates to help decrease the risk of diabetes. Lilith agreed to follow-up with Elizabeth Escobar as directed to closely monitor her progress.  2. Vitamin D deficiency Low Vitamin D level contributes to fatigue and are associated with obesity, breast, and colon cancer. We will refill prescription Vit D for 1  month. She will follow-up for routine testing of Vitamin D, at least 2-3 times per year to avoid over-replacement. We will continue to monitor.  - Vitamin D, Ergocalciferol, (DRISDOL) 1.25 MG (50000 UNIT) CAPS capsule; Take 1 capsule (50,000 Units total) by mouth every 7 (seven) days.  Dispense: 4 capsule; Refill: 0  3. Elevated blood pressure reading We will continue to monitor her progress.  4. At risk for hypertension Elizabeth Escobar was given approximately 15 minutes of hypertension prevention counseling today. Elizabeth Escobar is at risk for hypertension due to obesity. We discussed intensive lifestyle modifications today with an emphasis on weight loss as well as increasing exercise and decreasing salt intake.  5. Class 3 severe obesity with serious comorbidity and body mass index (BMI) of 60.0 to 69.9 in adult, unspecified obesity type ([place]) Elizabeth Escobar is currently in the action stage of change. As such, her goal is to continue with weight loss efforts. She has agreed to on the Category 4 Plan.   Exercise goals: Elizabeth Escobar is to exercise with videos on the weekend.  Behavioral modification strategies: planning for success.  Elizabeth Escobar has agreed to follow-up with our clinic in 2 weeks. She was informed of the importance of frequent follow-up visits to maximize her success with intensive lifestyle modifications for her multiple health conditions.   Objective:   Blood pressure 133/82, pulse 82, temperature 98.6 F (37 C), temperature source Oral, height 5\' 5"  (1.651 m), weight (!) 412 lb (186.9 kg), SpO2 98 %. Body mass index is 68.56 kg/m.  General: Cooperative, alert, well  developed, in no acute distress. HEENT: Conjunctivae and lids unremarkable. Cardiovascular: Regular rhythm.  Lungs: Normal work of breathing. Neurologic: No focal deficits.   Lab Results  Component Value Date   CREATININE 0.68 05/14/2019   BUN 12 05/14/2019   NA 139 05/14/2019   K 4.1 05/14/2019   CL 101 05/14/2019   CO2  25 05/14/2019   Lab Results  Component Value Date   ALT 14 05/14/2019   AST 15 05/14/2019   ALKPHOS 62 05/14/2019   BILITOT 0.4 05/14/2019   Lab Results  Component Value Date   HGBA1C 5.3 05/14/2019   Lab Results  Component Value Date   INSULIN 18.4 05/14/2019   Lab Results  Component Value Date   TSH 2.540 05/14/2019   Lab Results  Component Value Date   CHOL 161 05/14/2019   HDL 43 05/14/2019   LDLCALC 109 (H) 05/14/2019   TRIG 44 05/14/2019   Lab Results  Component Value Date   WBC 7.7 05/14/2019   HGB 12.2 05/14/2019   HCT 38.2 05/14/2019   MCV 85 05/14/2019   PLT 279 05/14/2019   Attestation Statements:   Reviewed by clinician on day of visit: allergies, medications, problem list, medical history, surgical history, family history, social history, and previous encounter notes.  Trude Mcburney, am acting as transcriptionist for [employer], DO.  I have reviewed the above documentation for accuracy and completeness, and I agree with the above. Helane Rima, DO

## 2019-07-30 ENCOUNTER — Ambulatory Visit (INDEPENDENT_AMBULATORY_CARE_PROVIDER_SITE_OTHER): Payer: BC Managed Care – PPO | Admitting: Family Medicine

## 2019-07-30 ENCOUNTER — Other Ambulatory Visit: Payer: Self-pay

## 2019-07-30 ENCOUNTER — Encounter (INDEPENDENT_AMBULATORY_CARE_PROVIDER_SITE_OTHER): Payer: Self-pay | Admitting: Family Medicine

## 2019-07-30 VITALS — BP 147/83 | HR 81 | Temp 98.3°F | Ht 65.0 in | Wt >= 6400 oz

## 2019-07-30 DIAGNOSIS — E8881 Metabolic syndrome: Secondary | ICD-10-CM

## 2019-07-30 DIAGNOSIS — Z6841 Body Mass Index (BMI) 40.0 and over, adult: Secondary | ICD-10-CM

## 2019-07-30 DIAGNOSIS — E559 Vitamin D deficiency, unspecified: Secondary | ICD-10-CM

## 2019-07-30 DIAGNOSIS — I10 Essential (primary) hypertension: Secondary | ICD-10-CM

## 2019-07-30 DIAGNOSIS — M25531 Pain in right wrist: Secondary | ICD-10-CM | POA: Diagnosis not present

## 2019-07-30 MED ORDER — VITAMIN D (ERGOCALCIFEROL) 1.25 MG (50000 UNIT) PO CAPS: 50000.0000 [IU] | ORAL_CAPSULE | ORAL | 0 refills | Status: DC

## 2019-07-30 NOTE — Progress Notes (Signed)
Chief Complaint:   OBESITY Elizabeth Escobar is here to discuss her progress with her obesity treatment plan along with follow-up of her obesity related diagnoses. Elizabeth Escobar is on the Category 4 Plan and states she is following her eating plan approximately 75% of the time. Elizabeth Escobar states she is exercising for 0 minutes 0 times per week.  Today's visit was #: 6  Starting weight:  426 lbs  Starting date: 05/14/2019 Today's weight: 408 lbs Today's date: 07/30/2019 Total lbs lost to date: 18 lbs Total lbs lost since last in-office visit: 4 lbs  Interim History: Elizabeth Escobar is doing well on her diet.  She has increased her NSAID use due to right wrist pain.  She has a sleep study scheduled on February 26.  Subjective:   1. Insulin resistance Elizabeth Escobar has a diagnosis of insulin resistance based on her elevated fasting insulin level >5. She continues to work on diet and exercise to decrease her risk of diabetes.  Lab Results  Component Value Date   INSULIN 18.4 05/14/2019   Lab Results  Component Value Date   HGBA1C 5.3 05/14/2019   2. Vitamin D deficiency Elizabeth Escobar's Vitamin D level was 30.5 on 05/14/2019. She is currently taking vit D. She denies nausea, vomiting or muscle weakness.  3. Essential hypertension Review: taking medications as instructed, no medication side effects noted, no chest pain on exertion, no dyspnea on exertion, no swelling of ankles.   BP Readings from Last 3 Encounters:  07/30/19 (!) 147/83  07/17/19 133/82  06/25/19 (!) 162/83   4. Right wrist pain Elizabeth Escobar is taking more NSAIDs due to her wrist pain.  Assessment/Plan:   1. Insulin resistance Sanyiah will continue to work on weight loss, exercise, and decreasing simple carbohydrates to help decrease the risk of diabetes. Elizabeth Escobar agreed to follow-up with [place] as directed to closely monitor her progress.  2. Vitamin D deficiency Low Vitamin D level contributes to fatigue and are associated with obesity, breast, and  colon cancer. She agrees to continue to take prescription Vitamin D @50 ,000 IU every week and will follow-up for routine testing of Vitamin D, at least 2-3 times per year to avoid over-replacement.  Orders - Vitamin D, Ergocalciferol, (DRISDOL) 1.25 MG (50000 UNIT) CAPS capsule; Take 1 capsule (50,000 Units total) by mouth every 7 (seven) days.  Dispense: 4 capsule; Refill: 0  3. Essential hypertension Elizabeth Escobar is working on healthy weight loss and exercise to improve blood pressure control. We will watch for signs of hypotension as she continues her lifestyle modifications.  Will consider HCTZ.  4. Right wrist pain Will follow. Will follow because mobility and pain control are important for weight management.  5. Class 3 severe obesity with serious comorbidity and body mass index (BMI) of 60.0 to 69.9 in adult, unspecified obesity type (Elizabeth Escobar) Elizabeth Escobar is currently in the action stage of change. As such, her goal is to continue with weight loss efforts. She has agreed to the Category 4 Plan.   Exercise goals: For substantial health benefits, adults should do at least 150 minutes (2 hours and 30 minutes) a week of moderate-intensity, or 75 minutes (1 hour and 15 minutes) a week of vigorous-intensity aerobic physical activity, or an equivalent combination of moderate- and vigorous-intensity aerobic activity. Aerobic activity should be performed in episodes of at least 10 minutes, and preferably, it should be spread throughout the week. Adults should also include muscle-strengthening activities that involve all major muscle groups on 2 or more days a week.  Behavioral modification strategies: increasing lean protein intake, decreasing simple carbohydrates, increasing vegetables and increasing water intake.  Elizabeth Escobar has agreed to follow-up with our clinic in 2 weeks. She was informed of the importance of frequent follow-up visits to maximize her success with intensive lifestyle modifications for her  multiple health conditions.   Objective:   Blood pressure (!) 147/83, pulse 81, temperature 98.3 F (36.8 C), temperature source Oral, height 5\' 5"  (1.651 m), weight (!) 408 lb (185.1 kg), SpO2 99 %. Body mass index is 67.89 kg/m.  General: Cooperative, alert, well developed, in no acute distress. HEENT: Conjunctivae and lids unremarkable. Cardiovascular: Regular rhythm.  Lungs: Normal work of breathing. Neurologic: No focal deficits.   Lab Results  Component Value Date   CREATININE 0.68 05/14/2019   BUN 12 05/14/2019   NA 139 05/14/2019   K 4.1 05/14/2019   CL 101 05/14/2019   CO2 25 05/14/2019   Lab Results  Component Value Date   ALT 14 05/14/2019   AST 15 05/14/2019   ALKPHOS 62 05/14/2019   BILITOT 0.4 05/14/2019   Lab Results  Component Value Date   HGBA1C 5.3 05/14/2019   Lab Results  Component Value Date   INSULIN 18.4 05/14/2019   Lab Results  Component Value Date   TSH 2.540 05/14/2019   Lab Results  Component Value Date   CHOL 161 05/14/2019   HDL 43 05/14/2019   LDLCALC 109 (H) 05/14/2019   TRIG 44 05/14/2019   Lab Results  Component Value Date   WBC 7.7 05/14/2019   HGB 12.2 05/14/2019   HCT 38.2 05/14/2019   MCV 85 05/14/2019   PLT 279 05/14/2019   Attestation Statements:   Reviewed by clinician on day of visit: allergies, medications, problem list, medical history, surgical history, family history, social history, and previous encounter notes.  I, 13/05/2019, CMA, am acting as Insurance claims handler for Energy manager, DO.  I have reviewed the above documentation for accuracy and completeness, and I agree with the above. [employer], DO

## 2019-08-01 DIAGNOSIS — Z309 Encounter for contraceptive management, unspecified: Secondary | ICD-10-CM | POA: Diagnosis not present

## 2019-08-20 ENCOUNTER — Encounter (INDEPENDENT_AMBULATORY_CARE_PROVIDER_SITE_OTHER): Payer: Self-pay

## 2019-08-21 ENCOUNTER — Ambulatory Visit (INDEPENDENT_AMBULATORY_CARE_PROVIDER_SITE_OTHER): Payer: BC Managed Care – PPO | Admitting: Family Medicine

## 2019-08-26 ENCOUNTER — Encounter (INDEPENDENT_AMBULATORY_CARE_PROVIDER_SITE_OTHER): Payer: Self-pay | Admitting: Family Medicine

## 2019-08-26 ENCOUNTER — Other Ambulatory Visit: Payer: Self-pay

## 2019-08-26 ENCOUNTER — Other Ambulatory Visit (HOSPITAL_COMMUNITY)
Admission: RE | Admit: 2019-08-26 | Discharge: 2019-08-26 | Disposition: A | Discharge status: Home / Self Care | Payer: BC Managed Care – PPO | Source: Ambulatory Visit | Attending: Neurology | Admitting: Neurology

## 2019-08-26 ENCOUNTER — Ambulatory Visit (INDEPENDENT_AMBULATORY_CARE_PROVIDER_SITE_OTHER): Payer: BC Managed Care – PPO | Admitting: Family Medicine

## 2019-08-26 VITALS — BP 125/78 | HR 90 | Temp 98.4°F | Ht 65.0 in | Wt >= 6400 oz

## 2019-08-26 DIAGNOSIS — Z01812 Encounter for preprocedural laboratory examination: Secondary | ICD-10-CM | POA: Diagnosis not present

## 2019-08-26 DIAGNOSIS — E8881 Metabolic syndrome: Secondary | ICD-10-CM

## 2019-08-26 DIAGNOSIS — E559 Vitamin D deficiency, unspecified: Secondary | ICD-10-CM

## 2019-08-26 DIAGNOSIS — Z20822 Contact with and (suspected) exposure to covid-19: Secondary | ICD-10-CM | POA: Insufficient documentation

## 2019-08-26 DIAGNOSIS — Z6841 Body Mass Index (BMI) 40.0 and over, adult: Secondary | ICD-10-CM | POA: Diagnosis not present

## 2019-08-26 LAB — SARS CORONAVIRUS 2 (TAT 6-24 HRS): SARS Coronavirus 2: NEGATIVE

## 2019-08-26 NOTE — Progress Notes (Signed)
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 Elizabeth Escobar is on the Category 4 Plan and states she is following her eating plan approximately 75% of the time. Elizabeth Escobar states she is exercising 0 minutes 0 times per week.  Today's visit was #: 7 Starting weight: 426 lbs Starting date: 05/14/2019 Today's weight: 405 lbs Today's date: 08/26/2019 Total lbs lost to date: 21 Total lbs lost since last in-office visit: 3  Interim History: Elizabeth Escobar used to eat quite a bit of indulgent eating, but she has refocused to more nutritionally valid options. Elizabeth Escobar is occasionally indulgent eating with foods like fried chicken or sodas. Patient reports that she is eating out often at breakfast and dinner, and then she is skipping lunch.  Subjective:   Vitamin D deficiency Shulamit's Vitamin D level was 30.5 on 05/14/2019. She is currently taking prescription vit D. She admits fatigue and denies nausea, vomiting or muscle weakness.  Insulin resistance Elizabeth Escobar has a diagnosis of insulin resistance. She is not on medications. She continues to work on diet and exercise to decrease her risk of diabetes. Elizabeth Escobar admits to carb cravings.  Lab Results  Component Value Date   INSULIN 18.4 05/14/2019   Lab Results  Component Value Date   HGBA1C 5.3 05/14/2019   Assessment/Plan:   Vitamin D deficiency Low Vitamin D level contributes to fatigue and are associated with obesity, breast, and colon cancer. Elizabeth Escobar will continue to take prescription Vitamin D @50 ,000 IU every week and she will follow-up for routine testing of Vitamin D, at least 2-3 times per year to avoid over-replacement.  Insulin resistance Elizabeth Escobar will continue to work on weight loss, exercise, and decreasing simple carbohydrates to help decrease the risk of diabetes. We will repeat labs in mid March. Elizabeth Escobar agreed to follow-up  with April as directed to closely monitor her progress.  Class 3 severe obesity with serious comorbidity and body mass index (BMI) of 60.0 to 69.9 in adult, unspecified obesity type ([facility]) Elizabeth Escobar is currently in the action stage of change. As such, her goal is to continue with weight loss efforts. She has agreed to the Category 4 Plan.   Behavioral modification strategies: increasing lean protein intake, increasing vegetables, meal planning and cooking strategies, keeping healthy foods in the home, avoiding temptations and planning for success.  Elizabeth Escobar has agreed to follow-up with our clinic in 3 weeks. She was informed of the importance of frequent follow-up visits to maximize her success with intensive lifestyle modifications for her multiple health conditions.   Objective:   Blood pressure 125/78, pulse 90, temperature 98.4 F (36.9 C), temperature source Oral, height 5\' 5"  (1.651 m), weight (!) 405 lb (183.7 kg), SpO2 97 %. Body mass index is 67.4 kg/m.  General: Cooperative, alert, well developed, in no acute distress. HEENT: Conjunctivae and lids unremarkable. Cardiovascular: Regular rhythm.  Lungs: Normal work of breathing. Neurologic: No focal deficits.   Lab Results  Component Value Date   CREATININE 0.68 05/14/2019   BUN 12 05/14/2019   NA 139 05/14/2019   K 4.1 05/14/2019   CL 101 05/14/2019   CO2 25 05/14/2019   Lab Results  Component Value Date   ALT 14 05/14/2019   AST 15 05/14/2019   ALKPHOS 62 05/14/2019   BILITOT 0.4 05/14/2019   Lab Results  Component Value Date   HGBA1C 5.3 05/14/2019   Lab Results  Component Value Date   INSULIN 18.4 05/14/2019   Lab Results  Component Value Date   TSH 2.540 05/14/2019   Lab Results  Component Value Date   CHOL 161 05/14/2019   HDL 43 05/14/2019   LDLCALC 109 (H) 05/14/2019   TRIG 44 05/14/2019   Lab Results  Component Value Date   WBC 7.7 05/14/2019   HGB 12.2 05/14/2019   HCT 38.2 05/14/2019   MCV 85  05/14/2019   PLT 279 05/14/2019   No results found for: IRON, TIBC, FERRITIN   Ref. Range 05/14/2019 11:30  Vitamin D, 25-Hydroxy Latest Ref Range: 30.0 - 100.0 ng/mL 30.5    Attestation Statements:   Reviewed by clinician on day of visit: allergies, medications, problem list, medical history, surgical history, family history, social history, and previous encounter notes.  Time spent on visit including pre-visit chart review and post-visit care was 16 minutes.   I, Doreene Nest, am acting as transcriptionist for Coralie Common, MD.  I have reviewed the above documentation for accuracy and completeness, and I agree with the above. - Ilene Qua, MD

## 2019-08-29 ENCOUNTER — Ambulatory Visit (INDEPENDENT_AMBULATORY_CARE_PROVIDER_SITE_OTHER): Payer: BC Managed Care – PPO | Admitting: Neurology

## 2019-08-29 ENCOUNTER — Other Ambulatory Visit: Payer: Self-pay

## 2019-08-29 DIAGNOSIS — R358 Other polyuria: Secondary | ICD-10-CM

## 2019-08-29 DIAGNOSIS — G4733 Obstructive sleep apnea (adult) (pediatric): Secondary | ICD-10-CM

## 2019-08-29 DIAGNOSIS — E669 Obesity, unspecified: Secondary | ICD-10-CM

## 2019-08-29 DIAGNOSIS — R0683 Snoring: Secondary | ICD-10-CM

## 2019-08-29 DIAGNOSIS — G4719 Other hypersomnia: Secondary | ICD-10-CM

## 2019-08-29 DIAGNOSIS — R3589 Other polyuria: Secondary | ICD-10-CM

## 2019-08-29 DIAGNOSIS — R351 Nocturia: Secondary | ICD-10-CM

## 2019-09-11 DIAGNOSIS — Z9989 Dependence on other enabling machines and devices: Secondary | ICD-10-CM | POA: Insufficient documentation

## 2019-09-11 DIAGNOSIS — G4733 Obstructive sleep apnea (adult) (pediatric): Secondary | ICD-10-CM | POA: Insufficient documentation

## 2019-09-11 NOTE — Addendum Note (Signed)
Addended by: Melvyn Novas on: 09/11/2019 12:32 PM   Modules accepted: Orders

## 2019-09-11 NOTE — Progress Notes (Signed)
POLYSOMNOGRAPHY IMPRESSION :   1. Severe Obstructive Sleep Apnea (OSA) with Hypoventilation, AHI  was 35.6/h and entirely hypopnea dependent.  OSA and Hypoxemia was strongly REM sleep and supine sleep dependent. REM AHI was 93.3/h (!)  2. CPAP did not control OSA and snoring, and BiPAP was used, all  under a FFM.  3. BiPAP at 16/12 cm water with heated humidity device and ResMed  F 30 I in medium alleviated AHI to 0.0/h. No snoring was noted.  No central apneas emerged.

## 2019-09-11 NOTE — Procedures (Signed)
PATIENT'S NAME:  Elizabeth, Escobar DOB:      03/10/1981      MR#:    [id number]     DATE OF RECORDING: 08/29/2019 REFERRING M.D.:  Briscoe Deutscher, DO Study Performed:  Split-Night Titration Study HISTORY:  Elizabeth Escobar, a 39 year old African American female patient is seen here face to face on 05/22/2019. Elizabeth Escobar has frequent nocturia but denies polydipsia. Her BMI at 426 pounds placed her into the super-obesity category. Dr. Juleen China just saw her in an introductory visit.  Referred by [facility].  Chief concern according to patient:" I may have sleep apnea- I have snored for many years"   I have the pleasure of seeing Elizabeth Escobar, a right -handed African American female with a possible sleep disorder. She has a medical history of Anemia, Back pain, Knee pain, and Obesity. Sleep relevant medical history: Nocturia every two hours, witnessed snoring. She sleeps alone.   The patient endorsed the Epworth Sleepiness Scale at 14 points   The patient's weight 432 pounds with a height of 64.5 (inches), resulting in a BMI of 73.8 kg/m2. The patient's neck circumference measured 16 inches.  CURRENT MEDICATIONS: Zyrtec, Advil, Depo-Provera, Multivitamin  PROCEDURE:  This is a multichannel digital polysomnogram utilizing the Somnostar 11.2 system.  Electrodes and sensors were applied and monitored per AASM Specifications.   EEG, EOG, Chin and Limb EMG, were sampled at 200 Hz.  ECG, Snore and Nasal Pressure, Thermal Airflow, Respiratory Effort, CPAP Flow and Pressure, Oximetry was sampled at 50 Hz. Digital video and audio were recorded.      BASELINE STUDY WITHOUT CPAP RESULTS:  Lights Out was at 21:08 and Lights On at 04:17.  Total recording time (TRT) was 154, with a total sleep time (TST) of 99.5 minutes.   The patient's sleep latency was 37 minutes. REM latency was 51 minutes.  The sleep efficiency was 64.6 %.    SLEEP ARCHITECTURE: WASO (Wake after sleep onset) was 3.5 minutes,  Stage N1 was 9.5 minutes, Stage N2 was 38 minutes, Stage N3 was 34 minutes and Stage R (REM sleep) was 18 minutes.  The percentages were Stage N1 9.5%, Stage N2 38.2%, Stage N3 34.2% and Stage R (REM sleep) 18.1%.   RESPIRATORY ANALYSIS:  There were a total of 59 respiratory events:   0 apneas and 59 hypopneas with a hypopnea index of 35.6.     The total APNEA/HYPOPNEA INDEX (AHI) was 35.6 /hour.  28 events occurred in REM sleep and 62 events in NREM. The REM AHI was 93.3 /hour versus a non-REM AHI of 22.8 /hour. The patient spent 27 minutes sleep time in the supine position 250 minutes in non-supine. The supine AHI was 0.0 /hour versus a non-supine AHI of 35.6 /hour.  OXYGEN SATURATION & C02:  The wake baseline 02 saturation was 96%, with the lowest being 73%. Time spent below 89% saturation equaled 14 minutes. Most desaturations were seen during REM sleep.   PERIODIC LIMB MOVEMENTS: The patient had a total of 0 Periodic Limb Movements  Audio and video analysis did not show any abnormal or unusual movements, behaviors, phonations or vocalizations. EKG was in keeping with normal sinus rhythm (NSR)   TITRATION STUDY WITH CPAP RESULTS: Due to the high AHI I the first 2 hours of sleep, we were able to implement PAP titration. The diagnosis is: Severe obstructive sleep apnea- hypopnea, with hypoventilation in REM sleep.    CPAP was initiated under a ResMed F30 I in medium  size (FFM) at 5 cmH20 with heated humidity per AASM split night standards and pressure was advanced to 15 cmH20 because of hypopneas, apneas and desaturations.  At a final PAP pressure of 15 cmH20, there was a reduction of the AHI to 18.5 /hour, no REM sleep was noted at this pressure.  The technologist changed to BiPAP modality, beginning at 15/12 cm and expanding to 16/12 cm water pressure where the AHI became 0.0 over 32 minutes of sleep with a nadir of 94% SpO2.    Total recording time (TRT) was 275.5 minutes, with a total sleep  time (TST) of 177 minutes. The patient's sleep latency was 46.5 minutes. REM latency was 45.5 minutes.  The sleep efficiency was 64.2 %.    SLEEP ARCHITECTURE: Wake after sleep was 86.5 minutes, Stage N1 46 minutes, Stage N2 84.5 minutes, Stage N3 5.5 minutes and Stage R (REM sleep) 41 minutes. The percentages were: Stage N1 26.%, Stage N2 47.7%, Stage N3 3.1% and Stage R (REM sleep) 23.2%.   RESPIRATORY ANALYSIS:  There were a total of 65 respiratory events: 22 obstructive apneas, 0 central apneas and 0 mixed apneas with a total of 22 apneas and an apnea index (AI) of 7.5. There were 43 hypopneas with a hypopnea index of 14.6 /hour. The patient also had 0 respiratory event related arousals (RERAs).   The total APNEA/HYPOPNEA INDEX  (AHI) was 22.0 /hour and the total RESPIRATORY DISTURBANCE INDEX was 22.0 /hour.  40 events occurred in REM sleep and 25 events in NREM. The REM AHI was 58.5 /hour versus a non-REM AHI of 11. /hour. The patient spent 15% of total sleep time in the supine position. The supine AHI was 62.2 /hour, versus a non-supine AHI of 14.8/hour.  OXYGEN SATURATION & C02:  The wake baseline 02 saturation was 95%, with the lowest being 72%. Time spent below 89% saturation equaled 15 minutes.  The arousals were noted as: 29 were spontaneous, 0 were associated with PLMs, 24 were associated with respiratory events. The patient had a total of 0 Periodic Limb Movements.       POLYSOMNOGRAPHY IMPRESSION :   1. Severe Obstructive Sleep Apnea (OSA) with Hypoventilation, AHI was 35.6/h and entirely hypopnea dependent. OSA, Hypoxemia was REM and supine sleep dependent. REM AHI was 93.3/h (!)  2. CPAP did not control OSA and snoring, and BiPAP was used, all under a FFM.  3. BiPAP at 16/12 cm water with heated humidity device and ResMed F 30 I in medium alleviated AHI to 0.0/h. No snoring was noted. No central apneas emerged.      RECOMMENDATIONS: I will order the above named device and at  the given settings , RV in 2-3 month with NP.    A follow up appointment will be scheduled in the Sleep Clinic at [facility].      I certify that I have reviewed the entire raw data recording prior to the issuance of this report in accordance with the Standards of Accreditation of the American Academy of Sleep Medicine (AASM)    Melvyn Novas, M.D. Diplomat, Biomedical engineer of Psychiatry and Neurology  Diplomat, Biomedical engineer of Sleep Medicine Wellsite geologist, [employer] [facility] [employer]

## 2019-09-15 ENCOUNTER — Telehealth: Payer: Self-pay | Admitting: Neurology

## 2019-09-15 NOTE — Telephone Encounter (Signed)
Pt called back in regards to missed call  

## 2019-09-15 NOTE — Telephone Encounter (Signed)
I called pt. I advised pt that Dr. Vickey Huger reviewed their sleep study results and found that pt has severe sleep apnea. Due to meeting criteria they were able to intiate CPAP and then move to BiPAP in the study.  Dr. Vickey Huger recommends that pt starts BiPAP 16/12 cm water pressure . I reviewed PAP compliance expectations with the pt. Pt is agreeable to starting a BiPAP. I advised pt that an order will be sent to a DME, [facility], and [facility] will call the pt within about one week after they file with the pt's insurance. [facility] will show the pt how to use the machine, fit for masks, and troubleshoot the CPAP if needed. A follow up appt was made for insurance purposes with Butch Penny, NP on June 8,2021 at 1 pm. Pt verbalized understanding to arrive 15 minutes early and bring their CPAP. A letter with all of this information in it will be mailed to the pt as a reminder. I verified with the pt that the address we have on file is correct. Pt verbalized understanding of results. Pt had no questions at this time but was encouraged to call back if questions arise. I have sent the order to [facility] and have received confirmation that they have received the order.

## 2019-09-15 NOTE — Telephone Encounter (Signed)
Called patient to discuss sleep study results. No answer at this time. LVM for the patient to call back.   

## 2019-09-15 NOTE — Telephone Encounter (Signed)
-----   Message from Melvyn Novas, MD sent at 09/11/2019 12:32 PM EST ----- POLYSOMNOGRAPHY IMPRESSION :   1. Severe Obstructive Sleep Apnea (OSA) with Hypoventilation, AHI  was 35.6/h and entirely hypopnea dependent.  OSA and Hypoxemia was strongly REM sleep and supine sleep dependent. REM AHI was 93.3/h (!)  2. CPAP did not control OSA and snoring, and BiPAP was used, all  under a FFM.  3. BiPAP at 16/12 cm water with heated humidity device and ResMed  F 30 I in medium alleviated AHI to 0.0/h. No snoring was noted.  No central apneas emerged.

## 2019-09-16 ENCOUNTER — Encounter (INDEPENDENT_AMBULATORY_CARE_PROVIDER_SITE_OTHER): Payer: Self-pay | Admitting: Family Medicine

## 2019-09-16 ENCOUNTER — Other Ambulatory Visit: Payer: Self-pay

## 2019-09-16 ENCOUNTER — Ambulatory Visit (INDEPENDENT_AMBULATORY_CARE_PROVIDER_SITE_OTHER): Payer: BC Managed Care – PPO | Admitting: Family Medicine

## 2019-09-16 VITALS — BP 151/80 | HR 78 | Temp 98.1°F | Ht 65.0 in | Wt >= 6400 oz

## 2019-09-16 DIAGNOSIS — R030 Elevated blood-pressure reading, without diagnosis of hypertension: Secondary | ICD-10-CM | POA: Diagnosis not present

## 2019-09-16 DIAGNOSIS — E8881 Metabolic syndrome: Secondary | ICD-10-CM | POA: Diagnosis not present

## 2019-09-16 DIAGNOSIS — G4730 Sleep apnea, unspecified: Secondary | ICD-10-CM | POA: Diagnosis not present

## 2019-09-16 DIAGNOSIS — Z6841 Body Mass Index (BMI) 40.0 and over, adult: Secondary | ICD-10-CM

## 2019-09-16 DIAGNOSIS — Z9189 Other specified personal risk factors, not elsewhere classified: Secondary | ICD-10-CM | POA: Diagnosis not present

## 2019-09-16 DIAGNOSIS — E559 Vitamin D deficiency, unspecified: Secondary | ICD-10-CM | POA: Diagnosis not present

## 2019-09-16 NOTE — Progress Notes (Signed)
Chief Complaint:   OBESITY Elizabeth Escobar is here to discuss her progress with her obesity treatment plan along with follow-up of her obesity related diagnoses. Elizabeth Escobar is on the Category 4 Plan and states she is following her eating plan approximately 75% of the time. Elizabeth Escobar states she is exercising for 0 minutes 0 times per week.  Today's visit was #: 8 Starting weight: 426 lbs Starting date: 05/14/2019 Today's weight: 401 lbs Today's date: 09/16/2019 Total lbs lost to date: 25 lbs Total lbs lost since last in-office visit: 4 lbs  Interim History: Elizabeth Escobar has provided the following food recall today:  Breakfast:  Fruit (4:30 am), sandwich (7:30 am) Lunch:  Sometimes protein bar, usually skips Dinner:  Chicken breast with vegetables and white rice (3:00 pm).  She is asleep by 7 pm.  She denies hunger or cravings.  Subjective:   1. Severe sleep apnea Elizabeth Escobar has a diagnosis of sleep apnea. She reports that she is not using a BiPAP regularly.   2. Insulin resistance Elizabeth Escobar has a diagnosis of insulin resistance based on her elevated fasting insulin level >5. She continues to work on diet and exercise to decrease her risk of diabetes.  Lab Results  Component Value Date   INSULIN 18.4 05/14/2019   Lab Results  Component Value Date   HGBA1C 5.3 05/14/2019   3. Elevated blood pressure reading Elizabeth Escobar denies chest pain, shortness of breath, headache, or edema.  4. Vitamin D deficiency Elizabeth Escobar's Vitamin D level was 30.5 on 05/14/2019. She has not consistently been taking her vitamin D.  5. At risk for heart disease Elizabeth Escobar is at a higher than average risk for cardiovascular disease due to obesity. Reviewed: no chest pain on exertion, no dyspnea on exertion, and no swelling of ankles.  Assessment/Plan:   1. Severe sleep apnea Intensive lifestyle modifications are the first line treatment for this issue. We discussed several lifestyle modifications today and she will continue  to work on diet, exercise and weight loss efforts. We will continue to monitor. Orders and follow up as documented in patient record.   Counseling  Sleep apnea is a condition in which breathing pauses or becomes shallow during sleep. This happens over and over during the night. This disrupts your sleep and keeps your body from getting the rest that it needs, which can cause tiredness and lack of energy (fatigue) during the day.  Sleep apnea treatment: If you were given a device to open your airway while you sleep, USE IT!  Sleep hygiene:   Limit or avoid alcohol, caffeinated beverages, and cigarettes, especially close to bedtime.   Do not eat a large meal or eat spicy foods right before bedtime. This can lead to digestive discomfort that can make it hard for you to sleep.  Keep a sleep diary to help you and your health care provider figure out what could be causing your insomnia.  . Make your bedroom a dark, comfortable place where it is easy to fall asleep. ? Put up shades or blackout curtains to block light from outside. ? Use a white noise machine to block noise. ? Keep the temperature cool. . Limit screen use before bedtime. This includes: ? Watching TV. ? Using your smartphone, tablet, or computer. . Stick to a routine that includes going to bed and waking up at the same times every day and night. This can help you fall asleep faster. Consider making a quiet activity, such as reading, part of your nighttime routine. Marland Kitchen  Try to avoid taking naps during the day so that you sleep better at night. . Get out of bed if you are still awake after 15 minutes of trying to sleep. Keep the lights down, but try reading or doing a quiet activity. When you feel sleepy, go back to bed.  2. Insulin resistance Elizabeth Escobar will continue to work on weight loss, exercise, and decreasing simple carbohydrates to help decrease the risk of diabetes. Elizabeth Escobar agreed to follow-up with [place] as directed to closely monitor  her progress.  3. Elevated blood pressure reading Counseling: Intensive lifestyle modifications are the first line treatment for this issue. We discussed several lifestyle modifications today and she will continue to work on diet, exercise and weight loss efforts. We will continue to monitor. Orders and follow up as documented in patient record.  4. Vitamin D deficiency Elizabeth Escobar will restart her vitamin D supplement.  5. At risk for heart disease Elizabeth Escobar was given approximately 15 minutes of coronary artery disease prevention counseling today. She is 39 y.o. female and has risk factors for heart disease including obesity. We discussed intensive lifestyle modifications today with an emphasis on specific weight loss instructions and strategies.   Repetitive spaced learning was employed today to elicit superior memory formation and behavioral change.  6. Class 3 severe obesity with serious comorbidity and body mass index (BMI) of 60.0 to 69.9 in adult, unspecified obesity type ([place]) Elizabeth Escobar is currently in the action stage of change. As such, her goal is to continue with weight loss efforts. She has agreed to the Category 4 Plan.   Exercise goals: No exercise has been prescribed at this time.  Behavioral modification strategies: increasing lean protein intake and increasing water intake.  Goal for next visit:   1) Obtain and use BiPAP.   2) Work on increasing protein at lunch and dinner.  Elizabeth Escobar has agreed to follow-up with our clinic in 2 weeks. She was informed of the importance of frequent follow-up visits to maximize her success with intensive lifestyle modifications for her multiple health conditions.   Objective:   Blood pressure (!) 151/80, pulse 78, temperature 98.1 F (36.7 C), temperature source Oral, height 5\' 5"  (1.651 m), weight (!) 401 lb (181.9 kg), SpO2 99 %. Body mass index is 66.73 kg/m.  General: Cooperative, alert, well developed, in no acute distress. HEENT:  Conjunctivae and lids unremarkable. Cardiovascular: Regular rhythm.  Lungs: Normal work of breathing. Neurologic: No focal deficits.   Lab Results  Component Value Date   CREATININE 0.68 05/14/2019   BUN 12 05/14/2019   NA 139 05/14/2019   K 4.1 05/14/2019   CL 101 05/14/2019   CO2 25 05/14/2019   Lab Results  Component Value Date   ALT 14 05/14/2019   AST 15 05/14/2019   ALKPHOS 62 05/14/2019   BILITOT 0.4 05/14/2019   Lab Results  Component Value Date   HGBA1C 5.3 05/14/2019   Lab Results  Component Value Date   INSULIN 18.4 05/14/2019   Lab Results  Component Value Date   TSH 2.540 05/14/2019   Lab Results  Component Value Date   CHOL 161 05/14/2019   HDL 43 05/14/2019   LDLCALC 109 (H) 05/14/2019   TRIG 44 05/14/2019   Lab Results  Component Value Date   WBC 7.7 05/14/2019   HGB 12.2 05/14/2019   HCT 38.2 05/14/2019   MCV 85 05/14/2019   PLT 279 05/14/2019   Attestation Statements:   Reviewed by clinician on day of  visit: allergies, medications, problem list, medical history, surgical history, family history, social history, and previous encounter notes.  I, Water quality scientist, CMA, am acting as Location manager for [employer], DO.  I have reviewed the above documentation for accuracy and completeness, and I agree with the above. Briscoe Deutscher, DO

## 2019-09-30 DIAGNOSIS — G4733 Obstructive sleep apnea (adult) (pediatric): Secondary | ICD-10-CM | POA: Diagnosis not present

## 2019-10-06 ENCOUNTER — Encounter (INDEPENDENT_AMBULATORY_CARE_PROVIDER_SITE_OTHER): Payer: Self-pay | Admitting: Family Medicine

## 2019-10-06 ENCOUNTER — Ambulatory Visit (INDEPENDENT_AMBULATORY_CARE_PROVIDER_SITE_OTHER): Payer: BC Managed Care – PPO | Admitting: Family Medicine

## 2019-10-06 ENCOUNTER — Other Ambulatory Visit: Payer: Self-pay

## 2019-10-06 VITALS — BP 138/79 | HR 81 | Temp 98.6°F | Ht 65.0 in | Wt >= 6400 oz

## 2019-10-06 DIAGNOSIS — Z3042 Encounter for surveillance of injectable contraceptive: Secondary | ICD-10-CM | POA: Diagnosis not present

## 2019-10-06 DIAGNOSIS — G4733 Obstructive sleep apnea (adult) (pediatric): Secondary | ICD-10-CM

## 2019-10-06 DIAGNOSIS — E8881 Metabolic syndrome: Secondary | ICD-10-CM | POA: Diagnosis not present

## 2019-10-06 DIAGNOSIS — E559 Vitamin D deficiency, unspecified: Secondary | ICD-10-CM

## 2019-10-06 DIAGNOSIS — Z9189 Other specified personal risk factors, not elsewhere classified: Secondary | ICD-10-CM | POA: Diagnosis not present

## 2019-10-06 DIAGNOSIS — Z6841 Body Mass Index (BMI) 40.0 and over, adult: Secondary | ICD-10-CM

## 2019-10-07 NOTE — Progress Notes (Signed)
Chief Complaint:   OBESITY Elizabeth Escobar is here to discuss her progress with her obesity treatment plan along with follow-up of her obesity related diagnoses. Elizabeth Escobar is on the Category 4 Plan and states she is following her eating plan approximately 75% of the time. Elizabeth Escobar states she is exercising for 0 minutes 0 times per week.  Today's visit was #: 9 Starting weight: 426 lbs Starting date: 05/14/2019 Today's weight: 406 lbs Today's date: 10/07/2019 Total lbs lost to date: 20 lbs Total lbs lost since last in-office visit: 0  Interim History: Elizabeth Escobar reports that she is still skipping meals.  She is now working 7 days per week between her two jobs.  Subjective:   1. Insulin resistance Elizabeth Escobar has a diagnosis of insulin resistance based on her elevated fasting insulin level >5. She continues to work on diet and exercise to decrease her risk of diabetes.  Lab Results  Component Value Date   INSULIN 18.4 05/14/2019   Lab Results  Component Value Date   HGBA1C 5.3 05/14/2019   2. OSA (obstructive sleep apnea) Elizabeth Escobar has a diagnosis of sleep apnea. She reports that she is not using a CPAP regularly.   3. Vitamin D deficiency Elizabeth Escobar's Vitamin D level was 30.5 on 05/14/2019. She is currently taking prescription vitamin D 50,000 IU each week. She denies nausea, vomiting or muscle weakness.  4. On Depo-Provera for contraception Elizabeth Escobar gets Depo-Provera injections for contraception. She has been on this for > 5 years.   5. At risk for heart disease Elizabeth Escobar is at a higher than average risk for cardiovascular disease due to obesity.   Assessment/Plan:   1. Insulin resistance [place] will continue to work on weight loss, exercise, and decreasing simple carbohydrates to help decrease the risk of diabetes. Elizabeth Escobar agreed to follow-up with [place] as directed to closely monitor her progress.  2. OSA (obstructive sleep apnea) Intensive lifestyle modifications are the first line  treatment for this issue. We discussed several lifestyle modifications today and she will continue to work on diet, exercise and weight loss efforts. We will continue to monitor. Orders and follow up as documented in patient record.   Counseling  Sleep apnea is a condition in which breathing pauses or becomes shallow during sleep. This happens over and over during the night. This disrupts your sleep and keeps your body from getting the rest that it needs, which can cause tiredness and lack of energy (fatigue) during the day.  Sleep apnea treatment: If you were given a device to open your airway while you sleep, USE IT!  Sleep hygiene:   Limit or avoid alcohol, caffeinated beverages, and cigarettes, especially close to bedtime.   Do not eat a large meal or eat spicy foods right before bedtime. This can lead to digestive discomfort that can make it hard for you to sleep.  Keep a sleep diary to help you and your health care provider figure out what could be causing your insomnia.  . Make your bedroom a dark, comfortable place where it is easy to fall asleep. ? Put up shades or blackout curtains to block light from outside. ? Use a white noise machine to block noise. ? Keep the temperature cool. . Limit screen use before bedtime. This includes: ? Watching TV. ? Using your smartphone, tablet, or computer. . Stick to a routine that includes going to bed and waking up at the same times every day and night. This can help you fall asleep faster. Consider  making a quiet activity, such as reading, part of your nighttime routine. . Try to avoid taking naps during the day so that you sleep better at night. . Get out of bed if you are still awake after 15 minutes of trying to sleep. Keep the lights down, but try reading or doing a quiet activity. When you feel sleepy, go back to bed.  3. Vitamin D deficiency Low Vitamin D level contributes to fatigue and are associated with obesity, breast, and colon  cancer. She agrees to continue to take prescription Vitamin D @50 ,000 IU every week and will follow-up for routine testing of Vitamin D, at least 2-3 times per year to avoid over-replacement.  4. On Depo-Provera for contraception Elizabeth Escobar will consider an IUD or other form of contraception. We discussed the obesogenic effect of Depo-Provera.   5. At risk for heart disease Elizabeth Escobar was given approximately 15 minutes of coronary artery disease prevention counseling today. She is 39 y.o. female and has risk factors for heart disease including obesity. We discussed intensive lifestyle modifications today with an emphasis on specific weight loss instructions and strategies.   Repetitive spaced learning was employed today to elicit superior memory formation and behavioral change.  6. Class 3 severe obesity with serious comorbidity and body mass index (BMI) of 60.0 to 69.9 in adult, unspecified obesity type (Elizabeth Escobar) Elizabeth Escobar is currently in the action stage of change. As such, her goal is to continue with weight loss efforts. She has agreed to the Category 4 Plan.   Exercise goals: As is.  Behavioral modification strategies: increasing lean protein intake.  Elizabeth Escobar has agreed to follow-up with our clinic in 2 weeks. She was informed of the importance of frequent follow-up visits to maximize her success with intensive lifestyle modifications for her multiple health conditions.   Objective:   Blood pressure 138/79, pulse 81, temperature 98.6 F (37 C), temperature source Oral, height 5\' 5"  (1.651 m), weight (!) 406 lb (184.2 kg), SpO2 96 %. Body mass index is 67.56 kg/m.  General: Cooperative, alert, well developed, in no acute distress. HEENT: Conjunctivae and lids unremarkable. Cardiovascular: Regular rhythm.  Lungs: Normal work of breathing. Neurologic: No focal deficits.   Lab Results  Component Value Date   CREATININE 0.68 05/14/2019   BUN 12 05/14/2019   NA 139 05/14/2019   K 4.1  05/14/2019   CL 101 05/14/2019   CO2 25 05/14/2019   Lab Results  Component Value Date   ALT 14 05/14/2019   AST 15 05/14/2019   ALKPHOS 62 05/14/2019   BILITOT 0.4 05/14/2019   Lab Results  Component Value Date   HGBA1C 5.3 05/14/2019   Lab Results  Component Value Date   INSULIN 18.4 05/14/2019   Lab Results  Component Value Date   TSH 2.540 05/14/2019   Lab Results  Component Value Date   CHOL 161 05/14/2019   HDL 43 05/14/2019   LDLCALC 109 (H) 05/14/2019   TRIG 44 05/14/2019   Lab Results  Component Value Date   WBC 7.7 05/14/2019   HGB 12.2 05/14/2019   HCT 38.2 05/14/2019   MCV 85 05/14/2019   PLT 279 05/14/2019   Attestation Statements:   Reviewed by clinician on day of visit: allergies, medications, problem list, medical history, surgical history, family history, social history, and previous encounter notes.  I, Water quality scientist, CMA, am acting as Location manager for [employer], DO.  I have reviewed the above documentation for accuracy and completeness, and I agree with  the above. Briscoe Deutscher, DO

## 2019-10-21 DIAGNOSIS — Z3042 Encounter for surveillance of injectable contraceptive: Secondary | ICD-10-CM | POA: Diagnosis not present

## 2019-10-21 DIAGNOSIS — Z0000 Encounter for general adult medical examination without abnormal findings: Secondary | ICD-10-CM | POA: Diagnosis not present

## 2019-10-21 DIAGNOSIS — Z6841 Body Mass Index (BMI) 40.0 and over, adult: Secondary | ICD-10-CM | POA: Diagnosis not present

## 2019-10-28 DIAGNOSIS — Z1231 Encounter for screening mammogram for malignant neoplasm of breast: Secondary | ICD-10-CM | POA: Diagnosis not present

## 2019-10-29 ENCOUNTER — Ambulatory Visit (INDEPENDENT_AMBULATORY_CARE_PROVIDER_SITE_OTHER): Payer: BC Managed Care – PPO | Admitting: Family Medicine

## 2019-10-29 ENCOUNTER — Encounter (INDEPENDENT_AMBULATORY_CARE_PROVIDER_SITE_OTHER): Payer: Self-pay | Admitting: Family Medicine

## 2019-10-29 ENCOUNTER — Other Ambulatory Visit: Payer: Self-pay

## 2019-10-29 VITALS — BP 118/62 | HR 74 | Temp 98.7°F | Ht 65.0 in | Wt >= 6400 oz

## 2019-10-29 DIAGNOSIS — D649 Anemia, unspecified: Secondary | ICD-10-CM

## 2019-10-29 DIAGNOSIS — E8881 Metabolic syndrome: Secondary | ICD-10-CM

## 2019-10-29 DIAGNOSIS — E559 Vitamin D deficiency, unspecified: Secondary | ICD-10-CM | POA: Diagnosis not present

## 2019-10-29 DIAGNOSIS — Z6841 Body Mass Index (BMI) 40.0 and over, adult: Secondary | ICD-10-CM

## 2019-10-29 DIAGNOSIS — Z9189 Other specified personal risk factors, not elsewhere classified: Secondary | ICD-10-CM | POA: Diagnosis not present

## 2019-10-29 NOTE — Progress Notes (Signed)
Chief Complaint:   OBESITY Elizabeth Escobar is here to discuss her progress with her obesity treatment plan along with follow-up of her obesity related diagnoses. Elizabeth Escobar is on the Category 4 Plan and states she is following her eating plan approximately 75% of the time. Elizabeth Escobar states she is exercising for 0 minutes 0 times per week.  Today's visit was #: 10 Starting weight: 426 lbs Starting date: 05/14/2019 Today's weight: 402 lbs Today's date: 10/29/2019 Total lbs lost to date: 24 lbs Total lbs lost since last in-office visit: 4 lbs  Interim History: Elizabeth Escobar will be seeing her GYN tomorrow to discuss getting an IUD.  She saw her PCP recently, and that note has been reviewed.  She is still working 7 days per week.  She says she tried some protein drinks and is not liking them so far.    Subjective:   1. Anemia, unspecified type Elizabeth Escobar is not a vegetarian.  She does not have a history of weight loss surgery.  Reviewed CBC at [facility].  Hgb 11.8, which is down from last office visit.  No menses.  CBC Latest Ref Rng & Units 05/14/2019  WBC 3.4 - 10.8 x10E3/uL 7.7  Hemoglobin 11.1 - 15.9 g/dL 62.9  Hematocrit 52.8 - 46.6 % 38.2  Platelets 150 - 450 x10E3/uL 279   No results found for: IRON, TIBC, FERRITIN Lab Results  Component Value Date   VITAMINB12 494 05/14/2019   2. Vitamin D deficiency Elizabeth Escobar's Vitamin D level was 30.5 on 05/14/2019. She is currently taking prescription vitamin D 50,000 IU each week. She denies nausea, vomiting or muscle weakness.  3. Insulin resistance Elizabeth Escobar has a diagnosis of insulin resistance based on her elevated fasting insulin level >5. She continues to work on diet and exercise to decrease her risk of diabetes.  Lab Results  Component Value Date   INSULIN 18.4 05/14/2019   Lab Results  Component Value Date   HGBA1C 5.3 05/14/2019   4. At risk for heart disease Elizabeth Escobar is at a higher than average risk for cardiovascular disease due to obesity.    Assessment/Plan:   1. Anemia, unspecified type Orders and follow up as documented in patient record.  Will check anemia panel today.  Counseling . Iron is essential for our bodies to make red blood cells.  Reasons that someone may be deficient include: an iron-deficient diet (more likely in those following vegan or vegetarian diets), women with heavy menses, patients with GI disorders or poor absorption, patients that have had bariatric surgery, frequent blood donors, patients with cancer, and patients with heart disease.   Elizabeth Escobar foods include dark leafy greens, red and white meats, eggs, seafood, and beans.   . Certain foods and drinks prevent your body from absorbing iron properly. Avoid eating these foods in the same meal as iron-rich foods or with iron supplements. These foods include: coffee, black tea, and red wine; milk, dairy products, and foods that are high in calcium; beans and soybeans; whole grains.  . Constipation can be a side effect of iron supplementation. Increased water and fiber intake are helpful. Water goal: > 2 liters/day. Fiber goal: > 25 grams/day.  Orders - Anemia panel  2. Vitamin D deficiency Low Vitamin D level contributes to fatigue and are associated with obesity, breast, and colon cancer. She agrees to continue to take prescription Vitamin D @50 ,000 IU every week and will follow-up for routine testing of Vitamin D, at least 2-3 times per year to avoid  over-replacement.  Orders - VITAMIN D 25 Hydroxy (Vit-D Deficiency, Fractures)  3. Insulin resistance Elizabeth Escobar will continue to work on weight loss, exercise, and decreasing simple carbohydrates to help decrease the risk of diabetes. Elizabeth Escobar agreed to follow-up with [place] as directed to closely monitor her progress.  Orders - Insulin, random  4. At risk for heart disease Elizabeth Escobar was given approximately 15 minutes of coronary artery disease prevention counseling today. She is 39 y.o. female and has risk  factors for heart disease including obesity. We discussed intensive lifestyle modifications today with an emphasis on specific weight loss instructions and strategies.   Repetitive spaced learning was employed today to elicit superior memory formation and behavioral change.  5. Class 3 severe obesity with serious comorbidity and body mass index (BMI) of 60.0 to 69.9 in adult, unspecified obesity type ([place]) Elizabeth Escobar is currently in the action stage of change. As such, her goal is to continue with weight loss efforts. She has agreed to the Category 4 Plan.   Recommend she try Fairlife milk.  Exercise goals: Sitting exercises.  Behavioral modification strategies: increasing lean protein intake and increasing water intake.  Elizabeth Escobar has agreed to follow-up with our clinic in 3 weeks. She was informed of the importance of frequent follow-up visits to maximize her success with intensive lifestyle modifications for her multiple health conditions.   Elizabeth Escobar was informed we would discuss her lab results at her next visit unless there is a critical issue that needs to be addressed sooner. Elizabeth Escobar agreed to keep her next visit at the agreed upon time to discuss these results.  Objective:   Blood pressure 118/62, pulse 74, temperature 98.7 F (37.1 C), temperature source Oral, height 5\' 5"  (1.651 m), weight (!) 402 lb (182.3 kg), SpO2 96 %. Body mass index is 66.9 kg/m.  General: Cooperative, alert, well developed, in no acute distress. HEENT: Conjunctivae and lids unremarkable. Cardiovascular: Regular rhythm.  Lungs: Normal work of breathing. Neurologic: No focal deficits.   Lab Results  Component Value Date   CREATININE 0.68 05/14/2019   BUN 12 05/14/2019   NA 139 05/14/2019   K 4.1 05/14/2019   CL 101 05/14/2019   CO2 25 05/14/2019   Lab Results  Component Value Date   ALT 14 05/14/2019   AST 15 05/14/2019   ALKPHOS 62 05/14/2019   BILITOT 0.4 05/14/2019   Lab Results  Component  Value Date   HGBA1C 5.3 05/14/2019   Lab Results  Component Value Date   INSULIN 18.4 05/14/2019   Lab Results  Component Value Date   TSH 2.540 05/14/2019   Lab Results  Component Value Date   CHOL 161 05/14/2019   HDL 43 05/14/2019   LDLCALC 109 (H) 05/14/2019   TRIG 44 05/14/2019   Lab Results  Component Value Date   WBC 7.7 05/14/2019   HGB 12.2 05/14/2019   HCT 38.2 05/14/2019   MCV 85 05/14/2019   PLT 279 05/14/2019   Attestation Statements:   Reviewed by clinician on day of visit: allergies, medications, problem list, medical history, surgical history, family history, social history, and previous encounter notes.  I, Water quality scientist, CMA, am acting as Location manager for [employer], DO.  I have reviewed the above documentation for accuracy and completeness, and I agree with the above. Briscoe Deutscher, DO

## 2019-10-30 DIAGNOSIS — Z309 Encounter for contraceptive management, unspecified: Secondary | ICD-10-CM | POA: Diagnosis not present

## 2019-10-30 LAB — ANEMIA PANEL
Ferritin: 179 ng/mL — ABNORMAL HIGH (ref 15–150)
Folate, Hemolysate: 307 ng/mL
Folate, RBC: 802 ng/mL (ref >498)
Hematocrit: 38.3 % (ref 34.0–46.6)
Iron Saturation: 13 % — ABNORMAL LOW (ref 15–55)
Iron: 37 ug/dL (ref 27–159)
Retic Ct Pct: 1.7 % (ref 0.6–2.6)
Total Iron Binding Capacity: 294 ug/dL (ref 250–450)
UIBC: 257 ug/dL (ref 131–425)
Vitamin B-12: 312 pg/mL (ref 232–1245)

## 2019-10-30 LAB — INSULIN, RANDOM: INSULIN: 217 u[IU]/mL — ABNORMAL HIGH (ref 2.6–24.9)

## 2019-10-30 LAB — VITAMIN D 25 HYDROXY (VIT D DEFICIENCY, FRACTURES): Vit D, 25-Hydroxy: 26.2 ng/mL — ABNORMAL LOW (ref 30.0–100.0)

## 2019-10-31 DIAGNOSIS — G4733 Obstructive sleep apnea (adult) (pediatric): Secondary | ICD-10-CM | POA: Diagnosis not present

## 2019-11-03 DIAGNOSIS — R590 Localized enlarged lymph nodes: Secondary | ICD-10-CM | POA: Diagnosis not present

## 2019-11-03 DIAGNOSIS — R928 Other abnormal and inconclusive findings on diagnostic imaging of breast: Secondary | ICD-10-CM | POA: Diagnosis not present

## 2019-11-19 ENCOUNTER — Other Ambulatory Visit: Payer: Self-pay

## 2019-11-19 ENCOUNTER — Ambulatory Visit (INDEPENDENT_AMBULATORY_CARE_PROVIDER_SITE_OTHER): Payer: BC Managed Care – PPO | Admitting: Family Medicine

## 2019-11-19 ENCOUNTER — Encounter (INDEPENDENT_AMBULATORY_CARE_PROVIDER_SITE_OTHER): Payer: Self-pay | Admitting: Family Medicine

## 2019-11-19 VITALS — BP 113/78 | HR 68 | Temp 98.2°F | Ht 65.0 in | Wt 399.0 lb

## 2019-11-19 DIAGNOSIS — E8881 Metabolic syndrome: Secondary | ICD-10-CM | POA: Diagnosis not present

## 2019-11-19 DIAGNOSIS — E559 Vitamin D deficiency, unspecified: Secondary | ICD-10-CM | POA: Diagnosis not present

## 2019-11-19 DIAGNOSIS — Z6841 Body Mass Index (BMI) 40.0 and over, adult: Secondary | ICD-10-CM

## 2019-11-19 DIAGNOSIS — Z9189 Other specified personal risk factors, not elsewhere classified: Secondary | ICD-10-CM | POA: Diagnosis not present

## 2019-11-19 MED ORDER — VITAMIN D (ERGOCALCIFEROL) 1.25 MG (50000 UNIT) PO CAPS: 50000.0000 [IU] | ORAL_CAPSULE | ORAL | 0 refills | Status: DC

## 2019-11-19 NOTE — Progress Notes (Signed)
Chief Complaint:   OBESITY Elizabeth Escobar is here to discuss her progress with her obesity treatment plan along with follow-up of her obesity related diagnoses. Elizabeth Escobar is on the Category 4 Plan and states she is following her eating plan approximately 75% of the time. Elizabeth Escobar states she is using hand pedals for 20 minutes 5 times per week.  Today's visit was #: 11 Starting weight: 426 lbs Starting date: 05/14/2019 Today's weight: 399 lbs Today's date: 11/19/2019 Total lbs lost to date: 27 lbs Total lbs lost since last in-office visit: 3 lbs  Interim History: Elizabeth Escobar is down 27 pounds today!  Elizabeth Escobar says she will be getting an IUD placed on Monday.  Her last Depo Provera was in mid April.  She is still working 7 days a week and is still focused on protein.  She is using hand pedals for exercise.  Subjective:   1. Vitamin D deficiency Elizabeth Escobar's Vitamin D level was 26.2 on 10/29/2019. She is currently taking prescription vitamin D 50,000 IU each week. She denies nausea, vomiting or muscle weakness.  2. Insulin resistance Elizabeth Escobar has a diagnosis of insulin resistance based on her elevated fasting insulin level >5. She continues to work on diet and exercise to decrease her risk of diabetes.  Lab Results  Component Value Date   INSULIN 217.0 (H) 10/29/2019   INSULIN 18.4 05/14/2019   Lab Results  Component Value Date   HGBA1C 5.3 05/14/2019   3. At risk for deficient intake of food The patient is at a higher than average risk of deficient intake of food.  Assessment/Plan:   1. Vitamin D deficiency Low Vitamin D level contributes to fatigue and are associated with obesity, breast, and colon cancer. She agrees to continue to take prescription Vitamin D @50 ,000 IU every week and will follow-up for routine testing of Vitamin D, at least 2-3 times per year to avoid over-replacement.  Orders - Vitamin D, Ergocalciferol, (DRISDOL) 1.25 MG (50000 UNIT) CAPS capsule; Take 1 capsule (50,000  Units total) by mouth every 7 (seven) days.  Dispense: 4 capsule; Refill: 0  2. Insulin resistance Elizabeth Escobar will continue to work on weight loss, exercise, and decreasing simple carbohydrates to help decrease the risk of diabetes. Elizabeth Escobar agreed to follow-up with as directed to closely monitor her progress.  3. At risk for deficient intake of food Elizabeth Escobar was given approximately 15 minutes of deficit intake of food prevention counseling today. Elizabeth Escobar is at risk for eating too few calories based on current food recall. She was encouraged to focus on meeting caloric and protein goals according to her recommended meal plan.   4. Class 3 severe obesity with serious comorbidity and body mass index (BMI) of 60.0 to 69.9 in adult, unspecified obesity type (HCC) Elizabeth Escobar is currently in the action stage of change. As such, her goal is to continue with weight loss efforts. She has agreed to the Category 4 Plan.   Exercise goals: For substantial health benefits, adults should do at least 150 minutes (2 hours and 30 minutes) a week of moderate-intensity, or 75 minutes (1 hour and 15 minutes) a week of vigorous-intensity aerobic physical activity, or an equivalent combination of moderate- and vigorous-intensity aerobic activity. Aerobic activity should be performed in episodes of at least 10 minutes, and preferably, it should be spread throughout the week.  Behavioral modification strategies: increasing lean protein intake.  Elizabeth Escobar has agreed to follow-up with our clinic in 2 weeks. She was informed of the importance  of frequent follow-up visits to maximize her success with intensive lifestyle modifications for her multiple health conditions.   Objective:   Blood pressure 113/78, pulse 68, temperature 98.2 F (36.8 C), temperature source Oral, height 5\' 5"  (1.651 m), weight (!) 399 lb (181 kg), SpO2 99 %. Body mass index is 66.4 kg/m.  General: Cooperative, alert, well developed, in no acute  distress. HEENT: Conjunctivae and lids unremarkable. Cardiovascular: Regular rhythm.  Lungs: Normal work of breathing. Neurologic: No focal deficits.   Lab Results  Component Value Date   CREATININE 0.68 05/14/2019   BUN 12 05/14/2019   NA 139 05/14/2019   K 4.1 05/14/2019   CL 101 05/14/2019   CO2 25 05/14/2019   Lab Results  Component Value Date   ALT 14 05/14/2019   AST 15 05/14/2019   ALKPHOS 62 05/14/2019   BILITOT 0.4 05/14/2019   Lab Results  Component Value Date   HGBA1C 5.3 05/14/2019   Lab Results  Component Value Date   INSULIN 217.0 (H) 10/29/2019   INSULIN 18.4 05/14/2019   Lab Results  Component Value Date   TSH 2.540 05/14/2019   Lab Results  Component Value Date   CHOL 161 05/14/2019   HDL 43 05/14/2019   LDLCALC 109 (H) 05/14/2019   TRIG 44 05/14/2019   Lab Results  Component Value Date   WBC 7.7 05/14/2019   HGB 12.2 05/14/2019   HCT 38.3 10/29/2019   MCV 85 05/14/2019   PLT 279 05/14/2019   Lab Results  Component Value Date   IRON 37 10/29/2019   TIBC 294 10/29/2019   FERRITIN 179 (H) 10/29/2019   Attestation Statements:   Reviewed by clinician on day of visit: allergies, medications, problem list, medical history, surgical history, family history, social history, and previous encounter notes.  I, Water quality scientist, CMA, am acting as Location manager for [employer], DO.  I have reviewed the above documentation for accuracy and completeness, and I agree with the above. Briscoe Deutscher, DO

## 2019-11-24 DIAGNOSIS — Z30430 Encounter for insertion of intrauterine contraceptive device: Secondary | ICD-10-CM | POA: Diagnosis not present

## 2019-11-24 DIAGNOSIS — Z3202 Encounter for pregnancy test, result negative: Secondary | ICD-10-CM | POA: Diagnosis not present

## 2019-11-30 DIAGNOSIS — G4733 Obstructive sleep apnea (adult) (pediatric): Secondary | ICD-10-CM | POA: Diagnosis not present

## 2019-12-09 ENCOUNTER — Ambulatory Visit: Payer: BC Managed Care – PPO | Admitting: Adult Health

## 2019-12-09 ENCOUNTER — Encounter: Payer: Self-pay | Admitting: Adult Health

## 2019-12-09 VITALS — BP 141/88 | HR 72 | Ht 64.0 in | Wt >= 6400 oz

## 2019-12-09 DIAGNOSIS — Z9989 Dependence on other enabling machines and devices: Secondary | ICD-10-CM

## 2019-12-09 DIAGNOSIS — G4733 Obstructive sleep apnea (adult) (pediatric): Secondary | ICD-10-CM | POA: Diagnosis not present

## 2019-12-09 NOTE — Progress Notes (Signed)
PATIENT: Elizabeth Escobar, Elizabeth Escobar DOB: Mar 14, 1981  REASON FOR VISIT: follow up HISTORY FROM: patient  HISTORY OF PRESENT ILLNESS: Today 12/09/19:  Elizabeth Escobar is a 39 year old female with a history of obstructive sleep apnea on CPAP.  Her download indicates that she use her machine 29 out of 30 days for compliance of 97%.  She use her machine greater than 4 hours each night.  On average she uses her machine 6 hours and 51 minutes.  Her residual AHI is 0.2 on 16/12 centimeters of water.  Her leak in the 95th percentile is 17.3 L/min.  She reports that she is sleeping better.  Notes that she has not noticed a big change in her energy during the day yet.  HISTORY: Elizabeth Escobar, a 39  year old African American female patientis seen here face to faceon 05/22/2019. Elizabeth Escobar has frequent nocturia but denies polydipsia. Her BMI at 426 pounds placed her into the super obesity category. Dr Juleen China just saw her in an introductory visit.  Referred by [facility].  Chiefconcernaccording to patient :" I may have sleep apnea- I have snored for many years"  I have the pleasure of seeing Elizabeth Escobar today,a right -handed  African American female with a possible sleep disorder. She   has a past medical history of Anemia, Back pain, Knee pain, and Obesity. Sleeprelevant medical history: Nocturia every two hours. Snoring, witnessed snoring. She sleeps alone.   Familymedical /sleep history:no  other family member with OSA, insomnia, sleep walkers.  Social history:Patient is working as Research scientist (life sciences) and lives in a household with 2 persons. Family status is single with 1 child in college - and with her mother.  The patient currently works in 2 jobs- [employer] Monday- Friday and weekends at [employer].Tobacco use; never,  ETOH use; none , Caffeine intake in form of Coffee( none) Soda( 1-2) Tea ( 1-2) - she just changed to to calory free sodas.  Regular exercise- none.    Sleep  habits are as follows:The patient's dinner time is between 3.30- 4 PM. She skips lunch .  The patient goes to bed at [facility] and  Has no trouble to go to sleep. She continues to sleep for 2 hours, wakes for several  bathroom breaks, the first time at 10 PM  Up to 8 hours of sleep except Mondays.  The preferred sleep position is lateral , with the support of 2 pillows. She likes to lift her feet with another pillow. Dreams are reportedly frequent/vivid. 3.30 AM is the usual rise time. The patient wakes up spontaneously before her alarm.  She reports mostly feeling refreshed and restored in AM, with symptoms such as drooling , and residual fatigue. Naps are taken frequently, lasting from 30 to 120 minutes in a recliner and are more refreshing than nocturnal sleep.    REVIEW OF SYSTEMS: Out of a complete 14 system review of symptoms, the patient complains only of the following symptoms, and all other reviewed systems are negative.  FSS 4 ESS 9  ALLERGIES: No Known Allergies  HOME MEDICATIONS: Outpatient Medications Prior to Visit  Medication Sig Dispense Refill  . cetirizine (ZYRTEC) 10 MG tablet Take 10 mg by mouth every other day.     . ibuprofen (ADVIL) 200 MG tablet Take 200 mg by mouth every 6 (six) hours as needed.    . Multiple Vitamins-Minerals (MULTIVITAMIN ADULT PO) Take by mouth.    . Vitamin D, Ergocalciferol, (DRISDOL) 1.25 MG (50000 UNIT) CAPS  capsule Take 1 capsule (50,000 Units total) by mouth every 7 (seven) days. 4 capsule 0   No facility-administered medications prior to visit.    PAST MEDICAL HISTORY: Past Medical History:  Diagnosis Date  . Anemia   . Back pain   . Knee pain   . Obesity     PAST SURGICAL HISTORY: No past surgical history on file.  FAMILY HISTORY: Family History  Problem Relation Age of Onset  . Cancer Mother   . Obesity Mother   . Lupus Sister     SOCIAL HISTORY: Social History   Socioeconomic History  . Marital status: Single     Spouse name: Not on file  . Number of children: Not on file  . Years of education: Not on file  . Highest education level: Not on file  Occupational History  . Not on file  Tobacco Use  . Smoking status: Never Smoker  . Smokeless tobacco: Never Used  Substance and Sexual Activity  . Alcohol use: No  . Drug use: No  . Sexual activity: Not on file  Other Topics Concern  . Not on file  Social History Narrative  . Not on file   Social Determinants of Health   Financial Resource Strain:   . Difficulty of Paying Living Expenses:   Food Insecurity:   . Worried About Programme researcher, broadcasting/film/video in the Last Year:   . Barista in the Last Year:   Transportation Needs:   . Freight forwarder (Medical):   Marland Kitchen Lack of Transportation (Non-Medical):   Physical Activity:   . Days of Exercise per Week:   . Minutes of Exercise per Session:   Stress:   . Feeling of Stress :   Social Connections:   . Frequency of Communication with Friends and Family:   . Frequency of Social Gatherings with Friends and Family:   . Attends Religious Services:   . Active Member of Clubs or Organizations:   . Attends Banker Meetings:   Marland Kitchen Marital Status:   Intimate Partner Violence:   . Fear of Current or Ex-Partner:   . Emotionally Abused:   Marland Kitchen Physically Abused:   . Sexually Abused:       PHYSICAL EXAM  Vitals:   12/09/19 1247  BP: (!) 141/88  Pulse: 72  Weight: (!) 406 lb (184.2 kg)  Height: 5\' 4"  (1.626 m)   Body mass index is 69.69 kg/m.  Generalized: Well developed, in no acute distress  Chest: Lungs clear to auscultation bilaterally  Neurological examination  Mentation: Alert oriented to time, place, history taking. Follows all commands speech and language fluent Cranial nerve II-XII: Extraocular movements were full, visual field were full on confrontational test Head turning and shoulder shrug  were normal and symmetric. Motor: The motor testing reveals 5 over 5  strength of all 4 extremities. Good symmetric motor tone is noted throughout.  Sensory: Sensory testing is intact to soft touch on all 4 extremities. No evidence of extinction is noted.  Gait and station: Gait is normal.    DIAGNOSTIC DATA (LABS, IMAGING, TESTING) - I reviewed patient records, labs, notes, testing and imaging myself where available.  Lab Results  Component Value Date   WBC 7.7 05/14/2019   HGB 12.2 05/14/2019   HCT 38.3 10/29/2019   MCV 85 05/14/2019   PLT 279 05/14/2019      Component Value Date/Time   NA 139 05/14/2019 1130   K 4.1 05/14/2019 1130  CL 101 05/14/2019 1130   CO2 25 05/14/2019 1130   GLUCOSE 81 05/14/2019 1130   BUN 12 05/14/2019 1130   CREATININE 0.68 05/14/2019 1130   CALCIUM 9.2 05/14/2019 1130   PROT 6.9 05/14/2019 1130   ALBUMIN 4.1 05/14/2019 1130   AST 15 05/14/2019 1130   ALT 14 05/14/2019 1130   ALKPHOS 62 05/14/2019 1130   BILITOT 0.4 05/14/2019 1130   GFRNONAA 111 05/14/2019 1130   GFRAA 127 05/14/2019 1130   Lab Results  Component Value Date   CHOL 161 05/14/2019   HDL 43 05/14/2019   LDLCALC 109 (H) 05/14/2019   TRIG 44 05/14/2019   Lab Results  Component Value Date   HGBA1C 5.3 05/14/2019   Lab Results  Component Value Date   VITAMINB12 312 10/29/2019   Lab Results  Component Value Date   TSH 2.540 05/14/2019      ASSESSMENT AND PLAN 39 y.o. year old female  has a past medical history of Anemia, Back pain, Knee pain, and Obesity. here with:  1. OSA on CPAP  - CPAP compliance excellent - Good treatment of AHI  - Encourage patient to use CPAP nightly and > 4 hours each night - F/U in 1 year or sooner if needed   I spent 20 minutes of face-to-face and non-face-to-face time with patient.  This included previsit chart review, lab review, study review, order entry, electronic health record documentation, patient education.  Butch Penny, MSN, NP-C 12/09/2019, 1:08 PM [facility] [address] [place], [place] [address] [phone]

## 2019-12-09 NOTE — Patient Instructions (Signed)
Continue using CPAP nightly and greater than 4 hours each night If your symptoms worsen or you develop new symptoms please let us know.   Please call [employer] at [phone], and press option 1 when prompted. Their customer service representatives will be glad to assist you. If they are unable to answer, please leave a message and they will call you back. Make sure to leave your name and return phone number.  

## 2019-12-15 ENCOUNTER — Ambulatory Visit (INDEPENDENT_AMBULATORY_CARE_PROVIDER_SITE_OTHER): Payer: BC Managed Care – PPO | Admitting: Family Medicine

## 2019-12-15 ENCOUNTER — Other Ambulatory Visit: Payer: Self-pay

## 2019-12-15 ENCOUNTER — Encounter (INDEPENDENT_AMBULATORY_CARE_PROVIDER_SITE_OTHER): Payer: Self-pay | Admitting: Family Medicine

## 2019-12-15 VITALS — BP 138/77 | HR 70 | Temp 98.5°F | Ht 65.0 in | Wt >= 6400 oz

## 2019-12-15 DIAGNOSIS — G4733 Obstructive sleep apnea (adult) (pediatric): Secondary | ICD-10-CM

## 2019-12-15 DIAGNOSIS — E559 Vitamin D deficiency, unspecified: Secondary | ICD-10-CM | POA: Diagnosis not present

## 2019-12-15 DIAGNOSIS — Z6841 Body Mass Index (BMI) 40.0 and over, adult: Secondary | ICD-10-CM

## 2019-12-15 DIAGNOSIS — Z9189 Other specified personal risk factors, not elsewhere classified: Secondary | ICD-10-CM

## 2019-12-15 DIAGNOSIS — E8881 Metabolic syndrome: Secondary | ICD-10-CM | POA: Diagnosis not present

## 2019-12-15 DIAGNOSIS — Z9989 Dependence on other enabling machines and devices: Secondary | ICD-10-CM

## 2019-12-16 NOTE — Progress Notes (Signed)
Chief Complaint:   OBESITY Elizabeth Escobar is here to discuss her progress with her obesity treatment plan along with follow-up of her obesity related diagnoses. Elizabeth Escobar is on the Category 4 Plan and states she is following her eating plan approximately 75% of the time. Elizabeth Escobar states she is doing strength training and cardio for 30 minutes 7 times per week.  Today's visit was #: 12 Starting weight: 426 lbs Starting date: 05/14/2019 Today's weight: 402 lbs Today's date: 12/15/2019 Total lbs lost to date: 24 lbs Total lbs lost since last in-office visit: 0  Interim History: Elizabeth Escobar says she has had to do less exercise for a few days due to a sore arm.  Otherwise, there is not much change to her eating regimen.  She has gotten her second COVID vaccine.  We discussed phentermine and Wellbutrin as future medication options.  She will have an IC at her next visit.  Subjective:   1. Insulin resistance Elizabeth Escobar has a diagnosis of insulin resistance based on her elevated fasting insulin level >5. She continues to work on diet and exercise to decrease her risk of diabetes.  Lab Results  Component Value Date   INSULIN 217.0 (H) 10/29/2019   INSULIN 18.4 05/14/2019   Lab Results  Component Value Date   HGBA1C 5.3 05/14/2019   2. Vitamin D deficiency Elizabeth Escobar's Vitamin D level was 26.2 on 10/29/2019. She is currently taking prescription vitamin D 50,000 IU each week. She denies nausea, vomiting or muscle weakness.  3. OSA on CPAP Elizabeth Escobar has a diagnosis of sleep apnea. She reports that she is using a CPAP regularly.   4. At risk for heart disease Elizabeth Escobar is at a higher than average risk for cardiovascular disease due to obesity.   Assessment/Plan:   1. Insulin resistance Elizabeth Escobar will continue to work on weight loss, exercise, and decreasing simple carbohydrates to help decrease the risk of diabetes. Elizabeth Escobar agreed to follow-up with [place] as directed to closely monitor her progress.  2.  Vitamin D deficiency Low Vitamin D level contributes to fatigue and are associated with obesity, breast, and colon cancer. She agrees to continue to take prescription Vitamin D @50 ,000 IU every week and will follow-up for routine testing of Vitamin D, at least 2-3 times per year to avoid over-replacement.  3. OSA on CPAP Intensive lifestyle modifications are the first line treatment for this issue. We discussed several lifestyle modifications today and she will continue to work on diet, exercise and weight loss efforts. We will continue to monitor. Orders and follow up as documented in patient record.   Counseling  Sleep apnea is a condition in which breathing pauses or becomes shallow during sleep. This happens over and over during the night. This disrupts your sleep and keeps your body from getting the rest that it needs, which can cause tiredness and lack of energy (fatigue) during the day.  Sleep apnea treatment: If you were given a device to open your airway while you sleep, USE IT!  Sleep hygiene:   Limit or avoid alcohol, caffeinated beverages, and cigarettes, especially close to bedtime.   Do not eat a large meal or eat spicy foods right before bedtime. This can lead to digestive discomfort that can make it hard for you to sleep.  Keep a sleep diary to help you and your health care provider figure out what could be causing your insomnia.  . Make your bedroom a dark, comfortable place where it is easy to fall asleep. ?  Put up shades or blackout curtains to block light from outside. ? Use a white noise machine to block noise. ? Keep the temperature cool. . Limit screen use before bedtime. This includes: ? Watching TV. ? Using your smartphone, tablet, or computer. . Stick to a routine that includes going to bed and waking up at the same times every day and night. This can help you fall asleep faster. Consider making a quiet activity, such as reading, part of your nighttime  routine. . Try to avoid taking naps during the day so that you sleep better at night. . Get out of bed if you are still awake after 15 minutes of trying to sleep. Keep the lights down, but try reading or doing a quiet activity. When you feel sleepy, go back to bed.  4. At risk for heart disease Elizabeth Escobar was given approximately 15 minutes of coronary artery disease prevention counseling today. She is 39 y.o. female and has risk factors for heart disease including obesity. We discussed intensive lifestyle modifications today with an emphasis on specific weight loss instructions and strategies.   Repetitive spaced learning was employed today to elicit superior memory formation and behavioral change.  5. Class 3 severe obesity with serious comorbidity and body mass index (BMI) of 60.0 to 69.9 in adult, unspecified obesity type (HCC) Elizabeth Escobar is currently in the action stage of change. As such, her goal is to continue with weight loss efforts. She has agreed to the Category 4 Plan.   Exercise goals: All adults should avoid inactivity. Some physical activity is better than none, and adults who participate in any amount of physical activity gain some health benefits.  Behavioral modification strategies: increasing lean protein intake and increasing water intake.  Elizabeth Escobar has agreed to follow-up with our clinic in 2-3 weeks. She was informed of the importance of frequent follow-up visits to maximize her success with intensive lifestyle modifications for her multiple health conditions.   Objective:   Blood pressure 138/77, pulse 70, temperature 98.5 F (36.9 C), temperature source Oral, height 5\' 5"  (1.651 m), weight (!) 402 lb (182.3 kg), SpO2 99 %. Body mass index is 66.9 kg/m.  General: Cooperative, alert, well developed, in no acute distress. HEENT: Conjunctivae and lids unremarkable. Cardiovascular: Regular rhythm.  Lungs: Normal work of breathing. Neurologic: No focal deficits.   Lab  Results  Component Value Date   CREATININE 0.68 05/14/2019   BUN 12 05/14/2019   NA 139 05/14/2019   K 4.1 05/14/2019   CL 101 05/14/2019   CO2 25 05/14/2019   Lab Results  Component Value Date   ALT 14 05/14/2019   AST 15 05/14/2019   ALKPHOS 62 05/14/2019   BILITOT 0.4 05/14/2019   Lab Results  Component Value Date   HGBA1C 5.3 05/14/2019   Lab Results  Component Value Date   INSULIN 217.0 (H) 10/29/2019   INSULIN 18.4 05/14/2019   Lab Results  Component Value Date   TSH 2.540 05/14/2019   Lab Results  Component Value Date   CHOL 161 05/14/2019   HDL 43 05/14/2019   LDLCALC 109 (H) 05/14/2019   TRIG 44 05/14/2019   Lab Results  Component Value Date   WBC 7.7 05/14/2019   HGB 12.2 05/14/2019   HCT 38.3 10/29/2019   MCV 85 05/14/2019   PLT 279 05/14/2019   Lab Results  Component Value Date   IRON 37 10/29/2019   TIBC 294 10/29/2019   FERRITIN 179 (H) 10/29/2019   Attestation  Statements:   Reviewed by clinician on day of visit: allergies, medications, problem list, medical history, surgical history, family history, social history, and previous encounter notes.  I, Water quality scientist, CMA, am acting as transcriptionist for Briscoe Deutscher, DO  I have reviewed the above documentation for accuracy and completeness, and I agree with the above. Briscoe Deutscher, DO

## 2019-12-31 DIAGNOSIS — G4733 Obstructive sleep apnea (adult) (pediatric): Secondary | ICD-10-CM | POA: Diagnosis not present

## 2020-01-01 DIAGNOSIS — Z30431 Encounter for routine checking of intrauterine contraceptive device: Secondary | ICD-10-CM | POA: Diagnosis not present

## 2020-01-07 ENCOUNTER — Other Ambulatory Visit: Payer: Self-pay

## 2020-01-07 ENCOUNTER — Ambulatory Visit (INDEPENDENT_AMBULATORY_CARE_PROVIDER_SITE_OTHER): Payer: BC Managed Care – PPO | Admitting: Family Medicine

## 2020-01-07 ENCOUNTER — Encounter (INDEPENDENT_AMBULATORY_CARE_PROVIDER_SITE_OTHER): Payer: Self-pay | Admitting: Family Medicine

## 2020-01-07 VITALS — BP 143/71 | HR 76 | Temp 98.3°F | Ht 65.0 in | Wt >= 6400 oz

## 2020-01-07 DIAGNOSIS — Z9189 Other specified personal risk factors, not elsewhere classified: Secondary | ICD-10-CM

## 2020-01-07 DIAGNOSIS — E8881 Metabolic syndrome: Secondary | ICD-10-CM | POA: Diagnosis not present

## 2020-01-07 DIAGNOSIS — R0602 Shortness of breath: Secondary | ICD-10-CM | POA: Diagnosis not present

## 2020-01-07 DIAGNOSIS — Z6841 Body Mass Index (BMI) 40.0 and over, adult: Secondary | ICD-10-CM

## 2020-01-08 NOTE — Progress Notes (Signed)
Chief Complaint:   OBESITY Elizabeth Escobar is here to discuss her progress with her obesity treatment plan along with follow-up of her obesity related diagnoses. Elizabeth Escobar is on the Category 4 Plan and states she is following her eating plan approximately 75% of the time. Elizabeth Escobar states she is strength training for 30 minutes 5 times per week.  Today's visit was #: 13 Starting weight: 426 lbs Starting date: 05/14/2019 Today's weight: 402 lbs Today's date: 01/07/2020 Total lbs lost to date: 24 lbs Total lbs lost since last in-office visit: 0  Interim History: Elizabeth Escobar's IC shows that her metabolism has improved despite her weight loss.  Subjective:   1. Insulin resistance Vannesa has a diagnosis of insulin resistance based on her elevated fasting insulin level >5. She continues to work on diet and exercise to decrease her risk of diabetes.  Lab Results  Component Value Date   INSULIN 217.0 (H) 10/29/2019   INSULIN 18.4 05/14/2019   Lab Results  Component Value Date   HGBA1C 5.3 05/14/2019   2. SOB (shortness of breath) on exertion Elizabeth Escobar endorses some shortness of breath on exertion and had an IC today.  3. At risk for heart disease Elizabeth Escobar is at a higher than average risk for cardiovascular disease due to obesity.   Assessment/Plan:   1. Insulin resistance Elizabeth Escobar will continue to work on weight loss, exercise, and decreasing simple carbohydrates to help decrease the risk of diabetes. Elizabeth Escobar agreed to follow-up with [place] as directed to closely monitor her progress.  Orders - metFORMIN (GLUCOPHAGE) 500 MG tablet; Take 1 tablet (500 mg total) by mouth daily.  Dispense: 30 tablet; Refill: 0  2. SOB (shortness of breath) on exertion Elizabeth Escobar had an IC today, and we discussed the results.  3. At risk for heart disease Elizabeth Escobar was given approximately 15 minutes of coronary artery disease prevention counseling today. She is 39 y.o. female and has risk factors for heart disease  including obesity. We discussed intensive lifestyle modifications today with an emphasis on specific weight loss instructions and strategies.   Repetitive spaced learning was employed today to elicit superior memory formation and behavioral change.  4. Class 3 severe obesity with serious comorbidity and body mass index (BMI) of 60.0 to 69.9 in adult, unspecified obesity type (HCC) Elizabeth Escobar is currently in the action stage of change. As such, her goal is to continue with weight loss efforts. She has agreed to the Category 4 Plan.   Exercise goals: For substantial health benefits, adults should do at least 150 minutes (2 hours and 30 minutes) a week of moderate-intensity, or 75 minutes (1 hour and 15 minutes) a week of vigorous-intensity aerobic physical activity, or an equivalent combination of moderate- and vigorous-intensity aerobic activity. Aerobic activity should be performed in episodes of at least 10 minutes, and preferably, it should be spread throughout the week.  Behavioral modification strategies: increasing lean protein intake and decreasing simple carbohydrates.  We discussed making higher protein lunch choices.  Elizabeth Escobar has agreed to follow-up with our clinic in 3 weeks. She was informed of the importance of frequent follow-up visits to maximize her success with intensive lifestyle modifications for her multiple health conditions.   Objective:   Blood pressure (!) 143/71, pulse 76, temperature 98.3 F (36.8 C), temperature source Oral, height 5\' 5"  (1.651 m), weight (!) 402 lb (182.3 kg), SpO2 98 %. Body mass index is 66.9 kg/m.  General: Cooperative, alert, well developed, in no acute distress. HEENT: Conjunctivae and lids  unremarkable. Cardiovascular: Regular rhythm.  Lungs: Normal work of breathing. Neurologic: No focal deficits.   Lab Results  Component Value Date   CREATININE 0.68 05/14/2019   BUN 12 05/14/2019   NA 139 05/14/2019   K 4.1 05/14/2019   CL 101  05/14/2019   CO2 25 05/14/2019   Lab Results  Component Value Date   ALT 14 05/14/2019   AST 15 05/14/2019   ALKPHOS 62 05/14/2019   BILITOT 0.4 05/14/2019   Lab Results  Component Value Date   HGBA1C 5.3 05/14/2019   Lab Results  Component Value Date   INSULIN 217.0 (H) 10/29/2019   INSULIN 18.4 05/14/2019   Lab Results  Component Value Date   TSH 2.540 05/14/2019   Lab Results  Component Value Date   CHOL 161 05/14/2019   HDL 43 05/14/2019   LDLCALC 109 (H) 05/14/2019   TRIG 44 05/14/2019   Lab Results  Component Value Date   WBC 7.7 05/14/2019   HGB 12.2 05/14/2019   HCT 38.3 10/29/2019   MCV 85 05/14/2019   PLT 279 05/14/2019   Lab Results  Component Value Date   IRON 37 10/29/2019   TIBC 294 10/29/2019   FERRITIN 179 (H) 10/29/2019   Attestation Statements:   Reviewed by clinician on day of visit: allergies, medications, problem list, medical history, surgical history, family history, social history, and previous encounter notes.  I, Insurance claims handler, CMA, am acting as transcriptionist for Helane Rima, DO  I have reviewed the above documentation for accuracy and completeness, and I agree with the above. Helane Rima, DO

## 2020-01-11 MED ORDER — METFORMIN HCL 500 MG PO TABS: 500.0000 mg | ORAL_TABLET | Freq: Every day | ORAL | 0 refills | Status: DC

## 2020-01-12 ENCOUNTER — Other Ambulatory Visit (INDEPENDENT_AMBULATORY_CARE_PROVIDER_SITE_OTHER): Payer: Self-pay | Admitting: Family Medicine

## 2020-01-12 DIAGNOSIS — E559 Vitamin D deficiency, unspecified: Secondary | ICD-10-CM

## 2020-01-12 NOTE — Telephone Encounter (Signed)
Pt forgot refill at [facility] on 01/07/20

## 2020-01-30 DIAGNOSIS — G4733 Obstructive sleep apnea (adult) (pediatric): Secondary | ICD-10-CM | POA: Diagnosis not present

## 2020-02-02 DIAGNOSIS — E559 Vitamin D deficiency, unspecified: Secondary | ICD-10-CM | POA: Insufficient documentation

## 2020-02-02 DIAGNOSIS — E88819 Insulin resistance, unspecified: Secondary | ICD-10-CM | POA: Insufficient documentation

## 2020-02-02 DIAGNOSIS — Z113 Encounter for screening for infections with a predominantly sexual mode of transmission: Secondary | ICD-10-CM | POA: Diagnosis not present

## 2020-02-02 DIAGNOSIS — Z6841 Body Mass Index (BMI) 40.0 and over, adult: Secondary | ICD-10-CM | POA: Diagnosis not present

## 2020-02-02 DIAGNOSIS — Z01419 Encounter for gynecological examination (general) (routine) without abnormal findings: Secondary | ICD-10-CM | POA: Diagnosis not present

## 2020-02-02 DIAGNOSIS — E8881 Metabolic syndrome: Secondary | ICD-10-CM | POA: Insufficient documentation

## 2020-02-03 ENCOUNTER — Other Ambulatory Visit (INDEPENDENT_AMBULATORY_CARE_PROVIDER_SITE_OTHER): Payer: Self-pay | Admitting: Family Medicine

## 2020-02-03 ENCOUNTER — Ambulatory Visit (INDEPENDENT_AMBULATORY_CARE_PROVIDER_SITE_OTHER): Payer: BC Managed Care – PPO | Admitting: Family Medicine

## 2020-02-03 ENCOUNTER — Other Ambulatory Visit: Payer: Self-pay

## 2020-02-03 VITALS — BP 144/82 | HR 73 | Temp 98.5°F | Ht 65.0 in | Wt 399.0 lb

## 2020-02-03 DIAGNOSIS — Z6841 Body Mass Index (BMI) 40.0 and over, adult: Secondary | ICD-10-CM | POA: Diagnosis not present

## 2020-02-03 DIAGNOSIS — E8881 Metabolic syndrome: Secondary | ICD-10-CM

## 2020-02-03 DIAGNOSIS — M25562 Pain in left knee: Secondary | ICD-10-CM | POA: Diagnosis not present

## 2020-02-03 DIAGNOSIS — E559 Vitamin D deficiency, unspecified: Secondary | ICD-10-CM

## 2020-02-03 DIAGNOSIS — E88819 Insulin resistance, unspecified: Secondary | ICD-10-CM

## 2020-02-04 NOTE — Progress Notes (Signed)
Chief Complaint:   OBESITY Elizabeth Escobar is here to discuss her progress with her obesity treatment plan along with follow-up of her obesity related diagnoses. Elizabeth Escobar is on the Category 4 Plan and states she is following her eating plan approximately 75% of the time. Elizabeth Escobar states she is doing strength training for 30 minutes 4 times per week.  Today's visit was #: 14 Starting weight: 426 lbs Starting date: 05/14/2019 Today's weight: 399 lbs Today's date: 02/03/2020 Total lbs lost to date: 27 lbs Total lbs lost since last in-office visit: 3 lbs  Interim History: Elizabeth Escobar started metformin 2 weeks ago.  She says she is tolerating it well.  She is doing chair exercises.  She has increased her water intake.  Subjective:   1. Insulin resistance Elizabeth Escobar has a diagnosis of insulin resistance based on her elevated fasting insulin level >5. She continues to work on diet and exercise to decrease her risk of diabetes.  Lab Results  Component Value Date   INSULIN 217.0 (H) 10/29/2019   INSULIN 18.4 05/14/2019   Lab Results  Component Value Date   HGBA1C 5.3 05/14/2019   2. Left knee pain Chronic, worsening.  Assessment/Plan:   1. Insulin resistance Elizabeth Escobar will continue to work on weight loss, exercise, and decreasing simple carbohydrates to help decrease the risk of diabetes. Elizabeth Escobar agreed to follow-up with [place] as directed to closely monitor her progress.  Continue metformin.  2. Left knee pain Reviewed treatment options.  3. Class 3 severe obesity with serious comorbidity and body mass index (BMI) of 60.0 to 69.9 in adult, unspecified obesity type (HCC) Elizabeth Escobar is currently in the action stage of change. As such, her goal is to continue with weight loss efforts. She has agreed to the Category 4 Plan.   Exercise goals: For substantial health benefits, adults should do at least 150 minutes (2 hours and 30 minutes) a week of moderate-intensity, or 75 minutes (1 hour and 15 minutes) a  week of vigorous-intensity aerobic physical activity, or an equivalent combination of moderate- and vigorous-intensity aerobic activity. Aerobic activity should be performed in episodes of at least 10 minutes, and preferably, it should be spread throughout the week.  Behavioral modification strategies: increasing lean protein intake, decreasing simple carbohydrates and increasing high fiber foods.  Tajah has agreed to follow-up with our clinic in 2-3 weeks. She was informed of the importance of frequent follow-up visits to maximize her success with intensive lifestyle modifications for her multiple health conditions.   Objective:   Blood pressure (!) 144/82, pulse 73, temperature 98.5 F (36.9 C), temperature source Oral, height 5\' 5"  (1.651 m), weight (!) 399 lb (181 kg), SpO2 99 %. Body mass index is 66.4 kg/m.  General: Cooperative, alert, well developed, in no acute distress. HEENT: Conjunctivae and lids unremarkable. Cardiovascular: Regular rhythm.  Lungs: Normal work of breathing. Neurologic: No focal deficits.   Lab Results  Component Value Date   CREATININE 0.68 05/14/2019   BUN 12 05/14/2019   NA 139 05/14/2019   K 4.1 05/14/2019   CL 101 05/14/2019   CO2 25 05/14/2019   Lab Results  Component Value Date   ALT 14 05/14/2019   AST 15 05/14/2019   ALKPHOS 62 05/14/2019   BILITOT 0.4 05/14/2019   Lab Results  Component Value Date   HGBA1C 5.3 05/14/2019   Lab Results  Component Value Date   INSULIN 217.0 (H) 10/29/2019   INSULIN 18.4 05/14/2019   Lab Results  Component Value  Date   TSH 2.540 05/14/2019   Lab Results  Component Value Date   CHOL 161 05/14/2019   HDL 43 05/14/2019   LDLCALC 109 (H) 05/14/2019   TRIG 44 05/14/2019   Lab Results  Component Value Date   WBC 7.7 05/14/2019   HGB 12.2 05/14/2019   HCT 38.3 10/29/2019   MCV 85 05/14/2019   PLT 279 05/14/2019   Lab Results  Component Value Date   IRON 37 10/29/2019   TIBC 294  10/29/2019   FERRITIN 179 (H) 10/29/2019   Attestation Statements:   Reviewed by clinician on day of visit: allergies, medications, problem list, medical history, surgical history, family history, social history, and previous encounter notes.  Time spent on visit including pre-visit chart review and post-visit care and documentation was 25 minutes.   I, Insurance claims handler, CMA, am acting as transcriptionist for Helane Rima, DO  I have reviewed the above documentation for accuracy and completeness, and I agree with the above. Helane Rima, DO

## 2020-02-05 DIAGNOSIS — Z20822 Contact with and (suspected) exposure to covid-19: Secondary | ICD-10-CM | POA: Diagnosis not present

## 2020-02-08 ENCOUNTER — Encounter (INDEPENDENT_AMBULATORY_CARE_PROVIDER_SITE_OTHER): Payer: Self-pay | Admitting: Family Medicine

## 2020-02-09 ENCOUNTER — Other Ambulatory Visit (INDEPENDENT_AMBULATORY_CARE_PROVIDER_SITE_OTHER): Payer: Self-pay | Admitting: Family Medicine

## 2020-02-09 DIAGNOSIS — E8881 Metabolic syndrome: Secondary | ICD-10-CM

## 2020-02-10 ENCOUNTER — Encounter (INDEPENDENT_AMBULATORY_CARE_PROVIDER_SITE_OTHER): Payer: Self-pay | Admitting: Family Medicine

## 2020-02-10 DIAGNOSIS — M79605 Pain in left leg: Secondary | ICD-10-CM

## 2020-02-10 MED ORDER — METFORMIN HCL 500 MG PO TABS: 500.0000 mg | ORAL_TABLET | Freq: Every day | ORAL | 0 refills | Status: DC

## 2020-02-10 NOTE — Telephone Encounter (Signed)
Please advise 

## 2020-02-17 DIAGNOSIS — R32 Unspecified urinary incontinence: Secondary | ICD-10-CM | POA: Diagnosis not present

## 2020-02-17 DIAGNOSIS — N393 Stress incontinence (female) (male): Secondary | ICD-10-CM | POA: Diagnosis not present

## 2020-02-17 DIAGNOSIS — M6281 Muscle weakness (generalized): Secondary | ICD-10-CM | POA: Diagnosis not present

## 2020-02-17 DIAGNOSIS — R3915 Urgency of urination: Secondary | ICD-10-CM | POA: Diagnosis not present

## 2020-02-19 ENCOUNTER — Other Ambulatory Visit: Payer: Self-pay

## 2020-02-19 ENCOUNTER — Ambulatory Visit: Payer: Self-pay

## 2020-02-19 ENCOUNTER — Ambulatory Visit (INDEPENDENT_AMBULATORY_CARE_PROVIDER_SITE_OTHER): Payer: BC Managed Care – PPO | Admitting: Family Medicine

## 2020-02-19 ENCOUNTER — Encounter: Payer: Self-pay | Admitting: Family Medicine

## 2020-02-19 VITALS — BP 136/82 | HR 93 | Ht 65.0 in | Wt >= 6400 oz

## 2020-02-19 DIAGNOSIS — E669 Obesity, unspecified: Secondary | ICD-10-CM

## 2020-02-19 DIAGNOSIS — G5712 Meralgia paresthetica, left lower limb: Secondary | ICD-10-CM | POA: Diagnosis not present

## 2020-02-19 DIAGNOSIS — M79652 Pain in left thigh: Secondary | ICD-10-CM | POA: Diagnosis not present

## 2020-02-19 MED ORDER — GABAPENTIN 300 MG PO CAPS: 300.0000 mg | ORAL_CAPSULE | Freq: Three times a day (TID) | ORAL | 3 refills | Status: DC | PRN

## 2020-02-19 NOTE — Patient Instructions (Addendum)
Thank you for coming in today.  Plan for trial of gabapentin,  Weight loss will help . This is meralgia paresthetica.   Recheck with me if not better. Can do an injection.

## 2020-02-19 NOTE — Progress Notes (Signed)
   Subjective:    I'm seeing this patient as a consultation for:  Dr. Earlene Plater. Note will be routed back to referring provider/PCP.  CC: L leg pain  I, Molly Weber, LAT, ATC, am serving as scribe for Dr. Clementeen Graham.  HPI: Pt is a 39 y/o female presenting w/ c/o L thigh pain x several years.  She states that she has been getting numbness and burning in her L lateral thigh.  She notes that over the past few weeks, she has begun to have some pain in the same area of the burning and numbness.  Low back pain: No Radiating pain: No L LE numbness/tingling: yes, numbness and burning in her L lateral thigh L LE weakness: no Aggravating factors: Walking; static standing Treatments tried: Nothing  Past medical history, Surgical history, Family history, Social history, Allergies, and medications have been entered into the medical record, reviewed.   Review of Systems: No new headache, visual changes, nausea, vomiting, diarrhea, constipation, dizziness, abdominal pain, skin rash, fevers, chills, night sweats, weight loss, swollen lymph nodes, body aches, joint swelling, muscle aches, chest pain, shortness of breath, mood changes, visual or auditory hallucinations.   Objective:    Vitals:   02/19/20 1400  BP: 136/82  Pulse: 93  SpO2: 98%   General: Well Developed, well nourished, and in no acute distress.  Neuro/Psych: Alert and oriented x3, extra-ocular muscles intact, able to move all 4 extremities, sensation grossly intact. Skin: Warm and dry, no rashes noted.  Respiratory: Not using accessory muscles, speaking in full sentences, trachea midline.  Cardiovascular: Pulses palpable, no extremity edema. Abdomen: Does not appear distended. MSK: Left hip normal-appearing nation nontender.  Normal strength.  Able to reproduce paresthesias in the lateral thigh with pressure over ASIS.   Impression and Recommendations:    Assessment and Plan: 39 y.o. female with left thigh paresthesia.   Meralgia paresthetica's chief diagnosis.  Treat with gabapentin and weight loss.  If not improving or sufficient patient return to clinic and we will proceed with trial of nerve block.   PDMP not reviewed this encounter. No orders of the defined types were placed in this encounter.  Meds ordered this encounter  Medications  . gabapentin (NEURONTIN) 300 MG capsule    Sig: Take 1 capsule (300 mg total) by mouth 3 (three) times daily as needed (nerve pain).    Dispense:  90 capsule    Refill:  3    Discussed warning signs or symptoms. Please see discharge instructions. Patient expresses understanding.   The above documentation has been reviewed and is accurate and complete Clementeen Graham, M.D.

## 2020-03-01 ENCOUNTER — Ambulatory Visit (INDEPENDENT_AMBULATORY_CARE_PROVIDER_SITE_OTHER): Payer: BC Managed Care – PPO | Admitting: Family Medicine

## 2020-03-01 ENCOUNTER — Encounter (INDEPENDENT_AMBULATORY_CARE_PROVIDER_SITE_OTHER): Payer: Self-pay | Admitting: Family Medicine

## 2020-03-01 ENCOUNTER — Other Ambulatory Visit: Payer: Self-pay

## 2020-03-01 VITALS — BP 146/79 | HR 78 | Temp 98.4°F | Ht 65.0 in | Wt 399.0 lb

## 2020-03-01 DIAGNOSIS — M79605 Pain in left leg: Secondary | ICD-10-CM | POA: Diagnosis not present

## 2020-03-01 DIAGNOSIS — E8881 Metabolic syndrome: Secondary | ICD-10-CM | POA: Diagnosis not present

## 2020-03-01 DIAGNOSIS — Z9189 Other specified personal risk factors, not elsewhere classified: Secondary | ICD-10-CM | POA: Diagnosis not present

## 2020-03-01 DIAGNOSIS — E88819 Insulin resistance, unspecified: Secondary | ICD-10-CM

## 2020-03-01 DIAGNOSIS — G4733 Obstructive sleep apnea (adult) (pediatric): Secondary | ICD-10-CM | POA: Diagnosis not present

## 2020-03-01 DIAGNOSIS — Z6841 Body Mass Index (BMI) 40.0 and over, adult: Secondary | ICD-10-CM

## 2020-03-02 DIAGNOSIS — N393 Stress incontinence (female) (male): Secondary | ICD-10-CM | POA: Diagnosis not present

## 2020-03-02 DIAGNOSIS — M62838 Other muscle spasm: Secondary | ICD-10-CM | POA: Diagnosis not present

## 2020-03-02 DIAGNOSIS — R351 Nocturia: Secondary | ICD-10-CM | POA: Diagnosis not present

## 2020-03-02 DIAGNOSIS — N3941 Urge incontinence: Secondary | ICD-10-CM | POA: Diagnosis not present

## 2020-03-02 NOTE — Progress Notes (Signed)
Chief Complaint:   OBESITY Elizabeth Escobar is here to discuss her progress with her obesity treatment plan along with follow-up of her obesity related diagnoses. Elizabeth Escobar is on the Category 4 Plan and states she is following her eating plan approximately 75% of the time. Elizabeth Escobar states she is stretching for 20-25 minutes 4-5 times per week.  Today's visit was #: 15 Starting weight: 426 lbs Starting date: 05/14/2019 Today's weight: 399 lbs Today's date: 03/01/2020 Total lbs lost to date: 27 lbs Total lbs lost since last in-office visit: 0 Total weight loss percentage to date: -6.34%  Interim History: Elizabeth Escobar reports urinary incontinence.  She is followed by GYN and is now in pelvic PT.   I have reviewed Dr. Zollie Pee note for left leg pain.   Assessment/Plan:   1. Insulin resistance Goal is HgbA1c < 5.7 and insulin level closer to 5. The current medical regimen is effective;  continue present plan and medications.  2. Left leg pain She does not feel like gabapentin is helping.  It is okay to stop.  She will call Sports Medicine to inquire about suggested injection.  3. At risk for heart disease Elizabeth Escobar was given approximately 15 minutes of coronary artery disease prevention counseling today. She is 39 y.o. female and has risk factors for heart disease including obesity and IR. We discussed intensive lifestyle modifications today with an emphasis on specific weight loss instructions and strategies.   During insulin resistance, several metabolic alterations induce the development of cardiovascular disease. For instance, insulin resistance can induce an imbalance in glucose metabolism that generates chronic hyperglycemia, which in turn triggers oxidative stress and causes an inflammatory response that leads to cell damage. Insulin resistance can also alter systemic lipid metabolism which then leads to the development of dyslipidemia and the well-known lipid triad: (1) high levels of plasma  triglycerides, (2) low levels of high-density lipoprotein, and (3) the appearance of small dense low-density lipoproteins. This triad, along with endothelial dysfunction, which can also be induced by aberrant insulin signaling, contribute to atherosclerotic plaque formation.   Repetitive spaced learning was employed today to elicit superior memory formation and behavioral change.  4. Class 3 severe obesity with serious comorbidity and body mass index (BMI) of 60.0 to 69.9 in adult, unspecified obesity type (HCC) Elizabeth Escobar is currently in the action stage of change. As such, her goal is to continue with weight loss efforts. She has agreed to the Category 4 Plan.   Exercise goals: For substantial health benefits, adults should do at least 150 minutes (2 hours and 30 minutes) a week of moderate-intensity, or 75 minutes (1 hour and 15 minutes) a week of vigorous-intensity aerobic physical activity, or an equivalent combination of moderate- and vigorous-intensity aerobic activity. Aerobic activity should be performed in episodes of at least 10 minutes, and preferably, it should be spread throughout the week.  Behavioral modification strategies: increasing lean protein intake.  Elizabeth Escobar has agreed to follow-up with our clinic in 3 weeks. She was informed of the importance of frequent follow-up visits to maximize her success with intensive lifestyle modifications for her multiple health conditions.   Objective:   Blood pressure (!) 146/79, pulse 78, temperature 98.4 F (36.9 C), temperature source Oral, height 5\' 5"  (1.651 m), weight (!) 399 lb (181 kg), SpO2 97 %. Body mass index is 66.4 kg/m.  General: Cooperative, alert, well developed, in no acute distress. HEENT: Conjunctivae and lids unremarkable. Cardiovascular: Regular rhythm.  Lungs: Normal work of breathing. Neurologic: No  focal deficits.   Lab Results  Component Value Date   CREATININE 0.68 05/14/2019   BUN 12 05/14/2019   NA 139  05/14/2019   K 4.1 05/14/2019   CL 101 05/14/2019   CO2 25 05/14/2019   Lab Results  Component Value Date   ALT 14 05/14/2019   AST 15 05/14/2019   ALKPHOS 62 05/14/2019   BILITOT 0.4 05/14/2019   Lab Results  Component Value Date   HGBA1C 5.3 05/14/2019   Lab Results  Component Value Date   INSULIN 217.0 (H) 10/29/2019   INSULIN 18.4 05/14/2019   Lab Results  Component Value Date   TSH 2.540 05/14/2019   Lab Results  Component Value Date   CHOL 161 05/14/2019   HDL 43 05/14/2019   LDLCALC 109 (H) 05/14/2019   TRIG 44 05/14/2019   Lab Results  Component Value Date   WBC 7.7 05/14/2019   HGB 12.2 05/14/2019   HCT 38.3 10/29/2019   MCV 85 05/14/2019   PLT 279 05/14/2019   Lab Results  Component Value Date   IRON 37 10/29/2019   TIBC 294 10/29/2019   FERRITIN 179 (H) 10/29/2019   Attestation Statements:   Reviewed by clinician on day of visit: allergies, medications, problem list, medical history, surgical history, family history, social history, and previous encounter notes.  I, Insurance claims handler, CMA, am acting as transcriptionist for Helane Rima, DO  I have reviewed the above documentation for accuracy and completeness, and I agree with the above. Helane Rima, DO

## 2020-03-04 ENCOUNTER — Encounter (INDEPENDENT_AMBULATORY_CARE_PROVIDER_SITE_OTHER): Payer: Self-pay

## 2020-03-04 ENCOUNTER — Other Ambulatory Visit (INDEPENDENT_AMBULATORY_CARE_PROVIDER_SITE_OTHER): Payer: Self-pay | Admitting: Family Medicine

## 2020-03-04 DIAGNOSIS — E8881 Metabolic syndrome: Secondary | ICD-10-CM

## 2020-03-04 NOTE — Telephone Encounter (Signed)
My chart message sent to pt.

## 2020-03-11 ENCOUNTER — Other Ambulatory Visit (INDEPENDENT_AMBULATORY_CARE_PROVIDER_SITE_OTHER): Payer: Self-pay | Admitting: Family Medicine

## 2020-03-11 ENCOUNTER — Ambulatory Visit (INDEPENDENT_AMBULATORY_CARE_PROVIDER_SITE_OTHER): Payer: BC Managed Care – PPO

## 2020-03-11 ENCOUNTER — Ambulatory Visit: Payer: Self-pay

## 2020-03-11 ENCOUNTER — Encounter: Payer: Self-pay | Admitting: Family Medicine

## 2020-03-11 ENCOUNTER — Encounter (INDEPENDENT_AMBULATORY_CARE_PROVIDER_SITE_OTHER): Payer: Self-pay

## 2020-03-11 ENCOUNTER — Other Ambulatory Visit: Payer: Self-pay

## 2020-03-11 ENCOUNTER — Ambulatory Visit (INDEPENDENT_AMBULATORY_CARE_PROVIDER_SITE_OTHER): Payer: BC Managed Care – PPO | Admitting: Family Medicine

## 2020-03-11 VITALS — BP 130/90 | HR 84 | Ht 65.0 in

## 2020-03-11 DIAGNOSIS — E669 Obesity, unspecified: Secondary | ICD-10-CM | POA: Diagnosis not present

## 2020-03-11 DIAGNOSIS — E8881 Metabolic syndrome: Secondary | ICD-10-CM

## 2020-03-11 DIAGNOSIS — M79652 Pain in left thigh: Secondary | ICD-10-CM

## 2020-03-11 DIAGNOSIS — G5712 Meralgia paresthetica, left lower limb: Secondary | ICD-10-CM

## 2020-03-11 MED ORDER — PREGABALIN 75 MG PO CAPS: 75.0000 mg | ORAL_CAPSULE | Freq: Two times a day (BID) | ORAL | 3 refills | Status: DC | PRN

## 2020-03-11 NOTE — Progress Notes (Signed)
Elizabeth Escobar, am serving as a Neurosurgeon for Dr. Clementeen Graham.  Elizabeth Escobar is a 39 y.o. female who presents to [facility] today for f/u of L thigh pain.  She was last seen by Dr. Denyse Amass on 02/19/20 and was prescribed Gabapentin.  Since her last visit, pt reports that she feels like the thigh has gotten worse. She has noticed that when she has to walk at work that the thigh will get hot and start to hurt and she will have to stop and take a break before walking again. Patient states the gabapentin did not help at all.  She notes she feels a burning pain in the side of her thigh mostly with standing and walking.  She has a limp a little bit as well but denies weakness.  Pertinent review of systems: No fevers or chills  Relevant historical information: Obesity sleep apnea   Exam:  BP 130/90 (BP Location: Left Wrist, Patient Position: Sitting, Cuff Size: Large)   Pulse 84   Ht 5\' 5"  (1.651 m)   BMI 66.40 kg/m  General: Well Developed, well nourished, and in no acute distress.   MSK: Left hip normal-appearing nontender to palpation anterior and lateral hip.  Hip abduction strength is intact.    Lab and Radiology Results  X-ray images left femur obtained today personally and independently reviewed Normal-appearing femur.  No significant abnormalities.  Mild hip and knee DJD present. Await formal radiology review  Procedure: Real-time Ultrasound Guided Injection of lateral femoral cutaneous nerve left hip Device: Philips Affiniti 50G Images permanently stored and available for review in PACS Verbal informed consent obtained.  Discussed risks and benefits of procedure. Warned about infection bleeding damage to structures skin hypopigmentation and fat atrophy among others. Patient expresses understanding and agreement Time-out conducted.   Noted no overlying erythema, induration, or other signs of local infection.   Skin prepped in a sterile fashion.    Local anesthesia: Topical Ethyl chloride.   With sterile technique and under real time ultrasound guidance:  40 mg of Kenalog and 2 mL of Marcaine injected easily.   Completed without difficulty   Pain immediately resolved suggesting accurate placement of the medication.   Advised to call if fevers/chills, erythema, induration, drainage, or persistent bleeding.   Images permanently stored and available for review in the ultrasound unit.  Impression: Technically successful ultrasound guided injection.      Assessment and Plan: 39 y.o. female with left lateral thigh burning pain.  Very likely meralgia paresthetica.  Ultrasound-guided injection/Hydro dissection of the lateral femoral cutaneous nerve of the left anterior hip made the burning pain in the side of her thigh turn into the numbness.  This indicates that the majority of her pain probably is meralgia paresthetica.  I am hopeful that the steroid component of the injection will provide some lasting benefit.  However she is limping and having some pain even with ambulation more the lateral aspect of her hip than the lateral thigh.  Is probable that she also has some trochanteric bursitis.  Plan for x-ray and physical therapy.  We will stop gabapentin and add Lyrica as needed for nerve pain.   PDMP not reviewed this encounter. Orders Placed This Encounter  Procedures  . 39 LIMITED JOINT SPACE STRUCTURES LOW LEFT(NO LINKED CHARGES)    Standing Status:   Future    Number of Occurrences:   1    Standing Expiration Date:   03/11/2021  Order Specific Question:   Reason for Exam (SYMPTOM  OR DIAGNOSIS REQUIRED)    Answer:   Left leg pain    Order Specific Question:   Preferred imaging location?    Answer:   Adult nurse [facility]  . DG FEMUR MIN 2 VIEWS LEFT    Standing Status:   Future    Standing Expiration Date:   03/11/2021    Order Specific Question:   Reason for Exam (SYMPTOM  OR DIAGNOSIS REQUIRED)    Answer:    eval lateral thigh pain    Order Specific Question:   Is patient pregnant?    Answer:   No    Order Specific Question:   Preferred imaging location?    Answer:   [facility]    Order Specific Question:   Radiology Contrast Protocol - do NOT remove file path    Answer:   [email or web address]  . Ambulatory referral to Physical Therapy    Referral Priority:   Routine    Referral Type:   Physical Medicine    Referral Reason:   Specialty Services Required    Requested Specialty:   Physical Therapy   Meds ordered this encounter  Medications  . pregabalin (LYRICA) 75 MG capsule    Sig: Take 1 capsule (75 mg total) by mouth 2 (two) times daily as needed (nerve pain).    Dispense:  60 capsule    Refill:  3     Discussed warning signs or symptoms. Please see discharge instructions. Patient expresses understanding.   The above documentation has been reviewed and is accurate and complete Clementeen Graham, M.D.

## 2020-03-11 NOTE — Patient Instructions (Addendum)
Thank you for coming in today. Call or go to the ER if you develop a large red swollen joint with extreme pain or oozing puss.  If the shot does not help let me know. I will order PT for possible hip bursitis.  Try lyrica instead of gabapentin. Can take 2x daily.   If we are pretty sure it is the nerve (meralgia paresthetica) and the shot does not help we can do more.    Get xray today.

## 2020-03-11 NOTE — Telephone Encounter (Signed)
My chart message sent to pt.

## 2020-03-12 NOTE — Progress Notes (Signed)
X-ray left femur looks pretty normal.  You have a little bit of knee arthritis present.

## 2020-03-15 ENCOUNTER — Other Ambulatory Visit (INDEPENDENT_AMBULATORY_CARE_PROVIDER_SITE_OTHER): Payer: Self-pay | Admitting: Family Medicine

## 2020-03-15 DIAGNOSIS — E8881 Metabolic syndrome: Secondary | ICD-10-CM

## 2020-03-16 DIAGNOSIS — R351 Nocturia: Secondary | ICD-10-CM | POA: Diagnosis not present

## 2020-03-16 DIAGNOSIS — N393 Stress incontinence (female) (male): Secondary | ICD-10-CM | POA: Diagnosis not present

## 2020-03-16 DIAGNOSIS — M6281 Muscle weakness (generalized): Secondary | ICD-10-CM | POA: Diagnosis not present

## 2020-03-16 DIAGNOSIS — R3915 Urgency of urination: Secondary | ICD-10-CM | POA: Diagnosis not present

## 2020-03-16 MED ORDER — METFORMIN HCL 500 MG PO TABS: 500.0000 mg | ORAL_TABLET | Freq: Every day | ORAL | 0 refills | Status: DC

## 2020-03-23 ENCOUNTER — Other Ambulatory Visit: Payer: Self-pay

## 2020-03-23 ENCOUNTER — Ambulatory Visit (INDEPENDENT_AMBULATORY_CARE_PROVIDER_SITE_OTHER): Payer: BC Managed Care – PPO | Admitting: Family Medicine

## 2020-03-23 ENCOUNTER — Encounter (INDEPENDENT_AMBULATORY_CARE_PROVIDER_SITE_OTHER): Payer: Self-pay | Admitting: Family Medicine

## 2020-03-23 VITALS — BP 154/95 | HR 68 | Temp 98.1°F | Ht 65.0 in | Wt >= 6400 oz

## 2020-03-23 DIAGNOSIS — Z6841 Body Mass Index (BMI) 40.0 and over, adult: Secondary | ICD-10-CM

## 2020-03-23 DIAGNOSIS — E88819 Insulin resistance, unspecified: Secondary | ICD-10-CM

## 2020-03-23 DIAGNOSIS — M79652 Pain in left thigh: Secondary | ICD-10-CM

## 2020-03-23 DIAGNOSIS — Z9189 Other specified personal risk factors, not elsewhere classified: Secondary | ICD-10-CM | POA: Diagnosis not present

## 2020-03-23 DIAGNOSIS — G4733 Obstructive sleep apnea (adult) (pediatric): Secondary | ICD-10-CM

## 2020-03-23 DIAGNOSIS — E8881 Metabolic syndrome: Secondary | ICD-10-CM | POA: Diagnosis not present

## 2020-03-23 DIAGNOSIS — E66813 Obesity, class 3: Secondary | ICD-10-CM

## 2020-03-23 DIAGNOSIS — Z9989 Dependence on other enabling machines and devices: Secondary | ICD-10-CM

## 2020-03-25 MED ORDER — INSULIN PEN NEEDLE 32G X 4 MM MISC: 1.0000 | Freq: Every day | 0 refills | Status: DC

## 2020-03-25 MED ORDER — SAXENDA 18 MG/3ML ~~LOC~~ SOPN: 3.0000 mg | PEN_INJECTOR | Freq: Every day | SUBCUTANEOUS | 0 refills | Status: DC

## 2020-03-25 NOTE — Progress Notes (Signed)
Chief Complaint:   OBESITY Elizabeth Escobar is here to discuss her progress with her obesity treatment plan along with follow-up of her obesity related diagnoses. Elizabeth Escobar is on the Category 4 Plan and states she is following her eating plan approximately 75% of the time. Elizabeth Escobar states she is doing couchersize and strength training for 30 minutes 5 times per week.  Today's visit was #: 16 Starting weight: 426 lbs Starting date: 05/14/2019 Today's weight: 401 lbs Today's date: 03/23/2020 Total lbs lost to date: 25 lbs Total lbs lost since last in-office visit: 0 Total weight loss percentage to date: -5.87%  Interim History: Elizabeth Escobar says she is still having pain left thigh pain.  She starts PT next week.  Assessment/Plan:   1. Insulin resistance Not at goal. Goal is HgbA1c < 5.7 and insulin level closer to 5. Lajoy is open to starting Saxenda in addition to Metformin.  Lab Results  Component Value Date   INSULIN 217.0 (H) 10/29/2019   INSULIN 18.4 05/14/2019   Lab Results  Component Value Date   HGBA1C 5.3 05/14/2019   2. OSA on CPAP Elizabeth Escobar has a diagnosis of sleep apnea. She reports that she is using a CPAP regularly.   Goal: Treatment of OSA via CPAP compliance and weight loss. . Plasma ghrelin levels (appetite or "hunger hormone") are significantly higher in OSA patients than in BMI-matched controls, but decrease to levels similar to those of obese patients without OSA after CPAP treatment.  . Weight loss improves OSA by several mechanisms, including reduction in fatty tissue in the throat (i.e. parapharyngeal fat) and the tongue. Loss of abdominal fat increases mediastinal traction on the upper airway making it less likely to collapse during sleep. . Studies have also shown that compliance with CPAP treatment improves leptin (hunger inhibitory hormone) imbalance.  3. Left thigh pain Starnisha is still having pain, but will be starting PT soon. Will continue to monitor symptoms  as they relate to her weight loss journey. This issue directly impacts care plan for optimization of BMI and metabolic health as it impacts the patient's ability to make lifestyle changes.  4. Metabolic syndrome Improving. Goal: Lose 7-10% of starting weight. She will continue to focus on protein-rich, low simple carbohydrate foods. We reviewed the importance of hydration, regular exercise for stress reduction, and restorative sleep.   5. At risk for constipation Elizabeth Escobar is at risk for constipation due to taking Saxenda.  She was given approximately 15 minutes of counseling today regarding prevention of constipation. She was encouraged to increase water and fiber intake.   6. Class 3 severe obesity with serious comorbidity and body mass index (BMI) of 60.0 to 69.9 in adult, unspecified obesity type [facility]) The patient denies a personal or family history of medullary thyroid cancer or MENII. The patient denies a history of pancreatitis. Patient understands that all anti-obesity medications are contraindicated in pregnancy. The potential risks and benefits of Saxenda were reviewed with the patient, and alternative treatment options were discussed. All questions were answered, and the patient wishes to move forward with this medication.  Please visit [email or web address] for information about this medication. Please visit [email or web address] to review how to administer Saxenda.   Start with escalation dose: ? Week 1: 0.6 mg [place] once daily x 1 week ? Week 2: 1.2 mg [place] once daily x 1 week ? Week 3: 1.8 mg [place] once daily x 1 week ? Week 4: 2.4 mg  once daily x 1 week ? Week 5  onward: 3 mg [place] once daily  - Liraglutide -Weight Management (SAXENDA) 18 MG/3ML SOPN; Inject 3 mg into the skin daily.  Dispense: 15 mL; Refill: 0 - Insulin Pen Needle 32G X 4 MM MISC; 1 each by Does not apply route daily.  Dispense: 100 each; Refill: 0  Elizabeth Escobar is currently in the action stage of change. As such, her goal is to  continue with weight loss efforts. She has agreed to the Category 4 Plan.   Exercise goals: PT.  Behavioral modification strategies: increasing lean protein intake, decreasing simple carbohydrates, increasing vegetables and increasing water intake.  Ama has agreed to follow-up with our clinic in 2-3 weeks. She was informed of the importance of frequent follow-up visits to maximize her success with intensive lifestyle modifications for her multiple health conditions.   Objective:   Blood pressure (!) 154/95, pulse 68, temperature 98.1 F (36.7 C), temperature source Oral, height 5\' 5"  (1.651 m), weight (!) 401 lb (181.9 kg), SpO2 99 %. Body mass index is 66.73 kg/m.  General: Cooperative, alert, well developed, in no acute distress. HEENT: Conjunctivae and lids unremarkable. Cardiovascular: Regular rhythm.  Lungs: Normal work of breathing. Neurologic: No focal deficits.   Lab Results  Component Value Date   CREATININE 0.68 05/14/2019   BUN 12 05/14/2019   NA 139 05/14/2019   K 4.1 05/14/2019   CL 101 05/14/2019   CO2 25 05/14/2019   Lab Results  Component Value Date   ALT 14 05/14/2019   AST 15 05/14/2019   ALKPHOS 62 05/14/2019   BILITOT 0.4 05/14/2019   Lab Results  Component Value Date   HGBA1C 5.3 05/14/2019   Lab Results  Component Value Date   INSULIN 217.0 (H) 10/29/2019   INSULIN 18.4 05/14/2019   Lab Results  Component Value Date   TSH 2.540 05/14/2019   Lab Results  Component Value Date   CHOL 161 05/14/2019   HDL 43 05/14/2019   LDLCALC 109 (H) 05/14/2019   TRIG 44 05/14/2019   Lab Results  Component Value Date   WBC 7.7 05/14/2019   HGB 12.2 05/14/2019   HCT 38.3 10/29/2019   MCV 85 05/14/2019   PLT 279 05/14/2019   Lab Results  Component Value Date   IRON 37 10/29/2019   TIBC 294 10/29/2019   FERRITIN 179 (H) 10/29/2019   Attestation Statements:   Reviewed by clinician on day of visit: allergies, medications, problem list,  medical history, surgical history, family history, social history, and previous encounter notes.  I, 10/31/2019, CMA, am acting as transcriptionist for Insurance claims handler, DO  I have reviewed the above documentation for accuracy and completeness, and I agree with the above. Helane Rima, DO

## 2020-03-30 ENCOUNTER — Ambulatory Visit: Payer: BC Managed Care – PPO

## 2020-03-30 ENCOUNTER — Other Ambulatory Visit: Payer: Self-pay

## 2020-04-01 DIAGNOSIS — G4733 Obstructive sleep apnea (adult) (pediatric): Secondary | ICD-10-CM | POA: Diagnosis not present

## 2020-04-05 DIAGNOSIS — N72 Inflammatory disease of cervix uteri: Secondary | ICD-10-CM | POA: Diagnosis not present

## 2020-04-05 DIAGNOSIS — N879 Dysplasia of cervix uteri, unspecified: Secondary | ICD-10-CM | POA: Diagnosis not present

## 2020-04-05 DIAGNOSIS — R87613 High grade squamous intraepithelial lesion on cytologic smear of cervix (HGSIL): Secondary | ICD-10-CM | POA: Diagnosis not present

## 2020-04-13 ENCOUNTER — Encounter (INDEPENDENT_AMBULATORY_CARE_PROVIDER_SITE_OTHER): Payer: Self-pay | Admitting: Family Medicine

## 2020-04-13 ENCOUNTER — Other Ambulatory Visit: Payer: Self-pay

## 2020-04-13 ENCOUNTER — Ambulatory Visit (INDEPENDENT_AMBULATORY_CARE_PROVIDER_SITE_OTHER): Payer: BC Managed Care – PPO | Admitting: Family Medicine

## 2020-04-13 VITALS — BP 132/79 | HR 78 | Temp 98.3°F | Ht 65.0 in | Wt 399.0 lb

## 2020-04-13 DIAGNOSIS — E559 Vitamin D deficiency, unspecified: Secondary | ICD-10-CM | POA: Diagnosis not present

## 2020-04-13 DIAGNOSIS — E8881 Metabolic syndrome: Secondary | ICD-10-CM

## 2020-04-13 DIAGNOSIS — E65 Localized adiposity: Secondary | ICD-10-CM | POA: Diagnosis not present

## 2020-04-13 DIAGNOSIS — Z6841 Body Mass Index (BMI) 40.0 and over, adult: Secondary | ICD-10-CM

## 2020-04-13 DIAGNOSIS — Z9989 Dependence on other enabling machines and devices: Secondary | ICD-10-CM

## 2020-04-13 DIAGNOSIS — G4733 Obstructive sleep apnea (adult) (pediatric): Secondary | ICD-10-CM | POA: Diagnosis not present

## 2020-04-15 NOTE — Progress Notes (Signed)
Chief Complaint:   OBESITY Elizabeth Escobar is here to discuss her progress with her obesity treatment plan along with follow-up of her obesity related diagnoses. Elizabeth Escobar is on the Category 4 Plan and states she is following her eating plan approximately 75% of the time. Elizabeth Escobar states she is doing couchersize for 15-20 minutes 5 times per week.  Today's visit was #: 17 Starting weight: 426 lbs Starting date: 05/14/2019 Today's weight: 399 lbs Today's date: 04/13/2020 Total lbs lost to date: 27 lbs Total lbs lost since last in-office visit: 2 lbs Total weight loss percentage to date: -6.34%  Interim History: Elizabeth Escobar says she brought her Saxenda pen with her to the office.  I educated her regarding Elizabeth Escobar and how to take her first injection.  She will stop taking metformin.  Assessment/Plan:   1. Vitamin D deficiency Current vitamin D is 26.2, tested on 10/29/2019. Not at goal. Optimal goal > 50 ng/dL.   Plan: Continue daily multivitamin with follow-up for routine testing of Vitamin D at least 2-3 times per year to avoid over-replacement.  2. Insulin resistance Not at goal. Goal is HgbA1c < 5.7 and insulin level closer to 5.   Elizabeth Escobar will continue to work on weight loss, exercise, and decreasing simple carbohydrates to help decrease the risk of diabetes. Elizabeth Escobar agreed to follow-up with [place] as directed to closely monitor her progress.  Elizabeth Escobar started [place] today in the office.  Lab Results  Component Value Date   INSULIN 217.0 (H) 10/29/2019   INSULIN 18.4 05/14/2019   Lab Results  Component Value Date   HGBA1C 5.3 05/14/2019   3. OSA on CPAP Goal: Treatment of OSA via CPAP compliance and weight loss. . Plasma ghrelin levels (appetite or "hunger hormone") are significantly higher in OSA patients than in BMI-matched controls, but decrease to levels similar to those of obese patients without OSA after CPAP treatment.  . Weight loss improves OSA by several mechanisms, including  reduction in fatty tissue in the throat (i.e. parapharyngeal fat) and the tongue. Loss of abdominal fat increases mediastinal traction on the upper airway making it less likely to collapse during sleep. . Studies have also shown that compliance with CPAP treatment improves leptin (hunger inhibitory hormone) imbalance.  4. Visceral obesity Current visceral fat rating: 31. Visceral adipose tissue is a hormonally active component of total body fat. This body composition phenotype is associated with medical disorders such as metabolic syndrome, cardiovascular disease and several malignancies including prostate, breast, and colorectal cancers. Goal: Lose 7-10% of starting weight. Visceral fat rating should be < 13.  5. Class 3 severe obesity with serious comorbidity and body mass index (BMI) of 60.0 to 69.9 in adult, unspecified obesity type (HCC)  Elizabeth Escobar is currently in the action stage of change. As such, her goal is to continue with weight loss efforts. She has agreed to the Category 4 Plan.   Exercise goals: As is.  Behavioral modification strategies: increasing lean protein intake, decreasing simple carbohydrates, increasing vegetables and increasing water intake.  Elizabeth Escobar has agreed to follow-up with our clinic in 3 weeks. She was informed of the importance of frequent follow-up visits to maximize her success with intensive lifestyle modifications for her multiple health conditions.   Objective:   Blood pressure 132/79, pulse 78, temperature 98.3 F (36.8 C), temperature source Oral, height 5\' 5"  (1.651 m), weight (!) 399 lb (181 kg), SpO2 99 %. Body mass index is 66.4 kg/m.  General: Cooperative, alert, well developed, in no  acute distress. HEENT: Conjunctivae and lids unremarkable. Cardiovascular: Regular rhythm.  Lungs: Normal work of breathing. Neurologic: No focal deficits.   Lab Results  Component Value Date   CREATININE 0.68 05/14/2019   BUN 12 05/14/2019   NA 139 05/14/2019    K 4.1 05/14/2019   CL 101 05/14/2019   CO2 25 05/14/2019   Lab Results  Component Value Date   ALT 14 05/14/2019   AST 15 05/14/2019   ALKPHOS 62 05/14/2019   BILITOT 0.4 05/14/2019   Lab Results  Component Value Date   HGBA1C 5.3 05/14/2019   Lab Results  Component Value Date   INSULIN 217.0 (H) 10/29/2019   INSULIN 18.4 05/14/2019   Lab Results  Component Value Date   TSH 2.540 05/14/2019   Lab Results  Component Value Date   CHOL 161 05/14/2019   HDL 43 05/14/2019   LDLCALC 109 (H) 05/14/2019   TRIG 44 05/14/2019   Lab Results  Component Value Date   WBC 7.7 05/14/2019   HGB 12.2 05/14/2019   HCT 38.3 10/29/2019   MCV 85 05/14/2019   PLT 279 05/14/2019   Lab Results  Component Value Date   IRON 37 10/29/2019   TIBC 294 10/29/2019   FERRITIN 179 (H) 10/29/2019   Attestation Statements:   Reviewed by clinician on day of visit: allergies, medications, problem list, medical history, surgical history, family history, social history, and previous encounter notes.  I, Insurance claims handler, CMA, am acting as transcriptionist for Helane Rima, DO  I have reviewed the above documentation for accuracy and completeness, and I agree with the above. Helane Rima, DO

## 2020-05-01 DIAGNOSIS — G4733 Obstructive sleep apnea (adult) (pediatric): Secondary | ICD-10-CM | POA: Diagnosis not present

## 2020-05-05 ENCOUNTER — Ambulatory Visit (INDEPENDENT_AMBULATORY_CARE_PROVIDER_SITE_OTHER): Payer: BC Managed Care – PPO | Admitting: Family Medicine

## 2020-05-05 ENCOUNTER — Encounter (INDEPENDENT_AMBULATORY_CARE_PROVIDER_SITE_OTHER): Payer: Self-pay | Admitting: Family Medicine

## 2020-05-05 ENCOUNTER — Other Ambulatory Visit: Payer: Self-pay

## 2020-05-05 VITALS — BP 125/77 | HR 82 | Temp 98.6°F | Ht 65.0 in | Wt 398.0 lb

## 2020-05-05 DIAGNOSIS — G4733 Obstructive sleep apnea (adult) (pediatric): Secondary | ICD-10-CM | POA: Diagnosis not present

## 2020-05-05 DIAGNOSIS — E8881 Metabolic syndrome: Secondary | ICD-10-CM | POA: Diagnosis not present

## 2020-05-05 DIAGNOSIS — Z9189 Other specified personal risk factors, not elsewhere classified: Secondary | ICD-10-CM

## 2020-05-05 DIAGNOSIS — Z9989 Dependence on other enabling machines and devices: Secondary | ICD-10-CM | POA: Diagnosis not present

## 2020-05-05 DIAGNOSIS — Z6841 Body Mass Index (BMI) 40.0 and over, adult: Secondary | ICD-10-CM

## 2020-05-06 NOTE — Progress Notes (Signed)
Chief Complaint:   OBESITY Elizabeth Escobar is here to discuss her progress with her obesity treatment plan along with follow-up of her obesity related diagnoses.   Today's visit was #: 18 Starting weight: 426 lbs Starting date: 05/14/2019 Today's weight: 398 lbs Today's date: 05/05/2020 Total lbs lost to date: 28 lbs Body mass index is 66.23 kg/m.  Total weight loss percentage to date: -6.57%  Interim History: Elizabeth Escobar is on Saxenda 1.2 mg subcutaneously weekly.  Plan:  Increase Saxenda to 1.8 mg subcutaneously weekly.  Goal is to increase to 3 mg slowly.  Nutrition Plan: the Category 4 Plan for 75% of the time.  Anti-obesity medications: Saxenda. Reported side effects: None. Hunger is well controlled controlled. Cravings are well controlled controlled.  Activity: None. Sleep: Sleep is restful.   Assessment/Plan:   1. Metabolic syndrome Visceral obesity rating of 32. Starting goal: Lose 7-10% of starting weight. She will continue to focus on protein-rich, low simple carbohydrate foods. We reviewed the importance of hydration, regular exercise for stress reduction, and restorative sleep.  We will continue to check lab work every 3 months, with 10% weight loss, or should any other concerns arise.  Plan: Continue Saxenda, with instructions to increase as tolerated to goal.   2. OSA on CPAP Compliant. We will continue to monitor symptoms as they relate to her weight loss journey.   Goal: Treatment of OSA via CPAP compliance and weight loss. . Plasma ghrelin levels (appetite or "hunger hormone") are significantly higher in OSA patients than in BMI-matched controls, but decrease to levels similar to those of obese patients without OSA after CPAP treatment.  . Weight loss improves OSA by several mechanisms, including reduction in fatty tissue in the throat (i.e. parapharyngeal fat) and the tongue. Loss of abdominal fat increases mediastinal traction on the upper airway making it less  likely to collapse during sleep. . Studies have also shown that compliance with CPAP treatment improves leptin (hunger inhibitory hormone) imbalance.  3. Insulin resistance Not at goal. Goal is HgbA1c < 5.7, fasting insulin closer to 5.  Medication: Saxenda.  She will continue to focus on protein-rich, low simple carbohydrate foods. We reviewed the importance of hydration, regular exercise for stress reduction, and restorative sleep.   Lab Results  Component Value Date   HGBA1C 5.3 05/14/2019   Lab Results  Component Value Date   INSULIN 217.0 (H) 10/29/2019   INSULIN 18.4 05/14/2019   4. At risk for nausea Elizabeth Escobar was given approximately 15 minutes of nausea prevention counseling today. Elizabeth Escobar is at risk for nausea due to increasing Saxenda. She was encouraged to titrate her medication slowly, make sure to stay hydrated, eat smaller portions throughout the day, and avoid high fat meals.   5. Class 3 severe obesity with serious comorbidity and body mass index (BMI) of 60.0 to 69.9 in adult, unspecified obesity type (HCC)  Course: Elizabeth Escobar is currently in the action stage of change. As such, her goal is to continue with weight loss efforts.   Nutrition goals: She has agreed to the Category 4 Plan.   Exercise goals: All adults should avoid inactivity. Some physical activity is better than none, and adults who participate in any amount of physical activity gain some health benefits.  Behavioral modification strategies: increasing lean protein intake, decreasing simple carbohydrates, increasing vegetables and increasing water intake.  Elizabeth Escobar has agreed to follow-up with our clinic in 4 weeks. She was informed of the importance of frequent follow-up visits to  maximize her success with intensive lifestyle modifications for her multiple health conditions.   Objective:   Blood pressure 125/77, pulse 82, temperature 98.6 F (37 C), temperature source Oral, height 5\' 5"  (1.651 m),  weight (!) 398 lb (180.5 kg), SpO2 97 %. Body mass index is 66.23 kg/m.  General: Cooperative, alert, well developed, in no acute distress. HEENT: Conjunctivae and lids unremarkable. Cardiovascular: Regular rhythm.  Lungs: Normal work of breathing. Neurologic: No focal deficits.   Lab Results  Component Value Date   CREATININE 0.68 05/14/2019   BUN 12 05/14/2019   NA 139 05/14/2019   K 4.1 05/14/2019   CL 101 05/14/2019   CO2 25 05/14/2019   Lab Results  Component Value Date   ALT 14 05/14/2019   AST 15 05/14/2019   ALKPHOS 62 05/14/2019   BILITOT 0.4 05/14/2019   Lab Results  Component Value Date   HGBA1C 5.3 05/14/2019   Lab Results  Component Value Date   INSULIN 217.0 (H) 10/29/2019   INSULIN 18.4 05/14/2019   Lab Results  Component Value Date   TSH 2.540 05/14/2019   Lab Results  Component Value Date   CHOL 161 05/14/2019   HDL 43 05/14/2019   LDLCALC 109 (H) 05/14/2019   TRIG 44 05/14/2019   Lab Results  Component Value Date   WBC 7.7 05/14/2019   HGB 12.2 05/14/2019   HCT 38.3 10/29/2019   MCV 85 05/14/2019   PLT 279 05/14/2019   Lab Results  Component Value Date   IRON 37 10/29/2019   TIBC 294 10/29/2019   FERRITIN 179 (H) 10/29/2019   Attestation Statements:   Reviewed by clinician on day of visit: allergies, medications, problem list, medical history, surgical history, family history, social history, and previous encounter notes.  I, 10/31/2019, CMA, am acting as transcriptionist for Insurance claims handler, DO  I have reviewed the above documentation for accuracy and completeness, and I agree with the above. Helane Rima, DO

## 2020-05-13 ENCOUNTER — Encounter (INDEPENDENT_AMBULATORY_CARE_PROVIDER_SITE_OTHER): Payer: Self-pay

## 2020-06-01 DIAGNOSIS — G4733 Obstructive sleep apnea (adult) (pediatric): Secondary | ICD-10-CM | POA: Diagnosis not present

## 2020-06-02 ENCOUNTER — Ambulatory Visit (INDEPENDENT_AMBULATORY_CARE_PROVIDER_SITE_OTHER): Payer: BC Managed Care – PPO | Admitting: Family Medicine

## 2020-06-07 ENCOUNTER — Other Ambulatory Visit (INDEPENDENT_AMBULATORY_CARE_PROVIDER_SITE_OTHER): Payer: Self-pay | Admitting: Family Medicine

## 2020-06-07 DIAGNOSIS — Z6841 Body Mass Index (BMI) 40.0 and over, adult: Secondary | ICD-10-CM

## 2020-06-08 MED ORDER — SAXENDA 18 MG/3ML ~~LOC~~ SOPN: 3.0000 mg | PEN_INJECTOR | Freq: Every day | SUBCUTANEOUS | 0 refills | Status: DC

## 2020-06-08 NOTE — Telephone Encounter (Signed)
Dr Wallace pt °

## 2020-06-14 ENCOUNTER — Ambulatory Visit (INDEPENDENT_AMBULATORY_CARE_PROVIDER_SITE_OTHER): Payer: BC Managed Care – PPO | Admitting: Family Medicine

## 2020-06-14 ENCOUNTER — Encounter (INDEPENDENT_AMBULATORY_CARE_PROVIDER_SITE_OTHER): Payer: Self-pay | Admitting: Family Medicine

## 2020-06-14 ENCOUNTER — Other Ambulatory Visit: Payer: Self-pay

## 2020-06-14 VITALS — BP 141/83 | HR 74 | Temp 98.4°F | Ht 65.0 in | Wt 394.0 lb

## 2020-06-14 DIAGNOSIS — R632 Polyphagia: Secondary | ICD-10-CM

## 2020-06-14 DIAGNOSIS — I10 Essential (primary) hypertension: Secondary | ICD-10-CM

## 2020-06-14 DIAGNOSIS — Z6841 Body Mass Index (BMI) 40.0 and over, adult: Secondary | ICD-10-CM

## 2020-06-14 DIAGNOSIS — E8881 Metabolic syndrome: Secondary | ICD-10-CM

## 2020-06-14 DIAGNOSIS — D649 Anemia, unspecified: Secondary | ICD-10-CM

## 2020-06-14 DIAGNOSIS — E88819 Insulin resistance, unspecified: Secondary | ICD-10-CM

## 2020-06-14 DIAGNOSIS — E559 Vitamin D deficiency, unspecified: Secondary | ICD-10-CM

## 2020-06-14 DIAGNOSIS — Z9189 Other specified personal risk factors, not elsewhere classified: Secondary | ICD-10-CM

## 2020-06-14 DIAGNOSIS — E65 Localized adiposity: Secondary | ICD-10-CM | POA: Diagnosis not present

## 2020-06-15 LAB — COMPREHENSIVE METABOLIC PANEL
ALT: 19 IU/L (ref 0–32)
AST: 12 IU/L (ref 0–40)
Albumin/Globulin Ratio: 1.8 (ref 1.2–2.2)
Albumin: 4.3 g/dL (ref 3.8–4.8)
Alkaline Phosphatase: 58 IU/L (ref 44–121)
BUN/Creatinine Ratio: 25 — ABNORMAL HIGH (ref 9–23)
BUN: 14 mg/dL (ref 6–24)
Bilirubin Total: 0.4 mg/dL (ref 0.0–1.2)
CO2: 24 mmol/L (ref 20–29)
Calcium: 9.2 mg/dL (ref 8.7–10.2)
Chloride: 102 mmol/L (ref 96–106)
Creatinine, Ser: 0.56 mg/dL — ABNORMAL LOW (ref 0.57–1.00)
GFR calc Af Amer: 134 mL/min/{1.73_m2} (ref >59)
GFR calc non Af Amer: 116 mL/min/{1.73_m2} (ref >59)
Globulin, Total: 2.4 g/dL (ref 1.5–4.5)
Glucose: 85 mg/dL (ref 65–99)
Potassium: 4.2 mmol/L (ref 3.5–5.2)
Sodium: 138 mmol/L (ref 134–144)
Total Protein: 6.7 g/dL (ref 6.0–8.5)

## 2020-06-15 LAB — ANEMIA PANEL
Ferritin: 142 ng/mL (ref 15–150)
Folate, Hemolysate: 314 ng/mL
Folate, RBC: 818 ng/mL (ref >498)
Hematocrit: 38.4 % (ref 34.0–46.6)
Iron Saturation: 11 % — ABNORMAL LOW (ref 15–55)
Iron: 36 ug/dL (ref 27–159)
Retic Ct Pct: 2 % (ref 0.6–2.6)
Total Iron Binding Capacity: 332 ug/dL (ref 250–450)
UIBC: 296 ug/dL (ref 131–425)
Vitamin B-12: 341 pg/mL (ref 232–1245)

## 2020-06-15 LAB — INSULIN, RANDOM: INSULIN: 28.3 u[IU]/mL — ABNORMAL HIGH (ref 2.6–24.9)

## 2020-06-15 LAB — LIPID PANEL
Chol/HDL Ratio: 3.4 ratio (ref 0.0–4.4)
Cholesterol, Total: 161 mg/dL (ref 100–199)
HDL: 47 mg/dL (ref >39)
LDL Chol Calc (NIH): 102 mg/dL — ABNORMAL HIGH (ref 0–99)
Triglycerides: 57 mg/dL (ref 0–149)
VLDL Cholesterol Cal: 12 mg/dL (ref 5–40)

## 2020-06-15 LAB — CBC WITH DIFFERENTIAL/PLATELET
Basophils Absolute: 0 10*3/uL (ref 0.0–0.2)
Basos: 0 %
EOS (ABSOLUTE): 0.2 10*3/uL (ref 0.0–0.4)
Eos: 3 %
Hemoglobin: 12.6 g/dL (ref 11.1–15.9)
Immature Grans (Abs): 0 10*3/uL (ref 0.0–0.1)
Immature Granulocytes: 0 %
Lymphocytes Absolute: 3.6 10*3/uL — ABNORMAL HIGH (ref 0.7–3.1)
Lymphs: 47 %
MCH: 29 pg (ref 26.6–33.0)
MCHC: 32.8 g/dL (ref 31.5–35.7)
MCV: 89 fL (ref 79–97)
Monocytes Absolute: 0.7 10*3/uL (ref 0.1–0.9)
Monocytes: 8 %
Neutrophils Absolute: 3.2 10*3/uL (ref 1.4–7.0)
Neutrophils: 42 %
Platelets: 284 10*3/uL (ref 150–450)
RBC: 4.34 x10E6/uL (ref 3.77–5.28)
RDW: 12.1 % (ref 11.7–15.4)
WBC: 7.7 10*3/uL (ref 3.4–10.8)

## 2020-06-15 LAB — VITAMIN D 25 HYDROXY (VIT D DEFICIENCY, FRACTURES): Vit D, 25-Hydroxy: 27.4 ng/mL — ABNORMAL LOW (ref 30.0–100.0)

## 2020-06-15 NOTE — Progress Notes (Signed)
Chief Complaint:   OBESITY Elizabeth Escobar is here to discuss her progress with her obesity treatment plan along with follow-up of her obesity related diagnoses.   Today's visit was #: 19 Starting weight: 426 lbs Starting date: 05/14/2019 Today's weight: 394 lbs Today's date: 06/14/2020 Total lbs lost to date: 32 lbs Body mass index is 65.57 kg/m.  Total weight loss percentage to date: -7.51%  Interim History: Melisssa is on Saxenda 1.8 mg subcutaneously daily.  She says that her work involves a lot of walking/standing. Nutrition Plan: the Category 4 Plan for 75% of the time.  Anti-obesity medications: Saxenda 1.8 mg subcutaneously daily.  Hunger is well controlled. Cravings are well controlled.  Activity: Strength training with 5 pound weights for 20 minutes 4 times per week.  Assessment/Plan:   1. Polyphagia Improving with Saxenda use. She will continue to focus on protein-rich, low simple carbohydrate foods. We reviewed the importance of hydration, regular exercise for stress reduction, and restorative sleep.  She is taking Saxenda 3 mg subcutaneously daily.  2. Metabolic syndrome Starting goal: Lose 7-10% of starting weight. She will continue to focus on protein-rich, low simple carbohydrate foods. We reviewed the importance of hydration, regular exercise for stress reduction, and restorative sleep.  We will continue to check lab work every 3 months, with 10% weight loss, or should any other concerns arise.  - Comprehensive metabolic panel - Lipid panel  3. Visceral obesity Current visceral fat rating: 30. Visceral fat rating should be < 13. Visceral adipose tissue is a hormonally active component of total body fat. This body composition phenotype is associated with medical disorders such as metabolic syndrome, cardiovascular disease and several malignancies including prostate, breast, and colorectal cancers. Starting goal: Lose 7-10% of starting weight. .  4. Vitamin D  deficiency Not at goal. Current vitamin D is 26.2, tested on 10/29/2019. Optimal goal > 50 ng/dL.   Plan:  []   Continue Vitamin D @50 ,000 IU every week. []   Continue home supplement daily. [x]   Check vitamin D level today.  - VITAMIN D 25 Hydroxy (Vit-D Deficiency, Fractures)  5. Insulin resistance Not at goal. Goal is HgbA1c < 5.7, fasting insulin closer to 5.  She will continue to focus on protein-rich, low simple carbohydrate foods. We reviewed the importance of hydration, regular exercise for stress reduction, and restorative sleep.   Lab Results  Component Value Date   HGBA1C 5.3 05/14/2019   Lab Results  Component Value Date   INSULIN 217.0 (H) 10/29/2019   INSULIN 18.4 05/14/2019   - Insulin, random  6. Essential hypertension Elevated today.    Plan: Avoid buying foods that are: processed, frozen, or prepackaged to avoid excess salt. We will continue to monitor symptoms as they relate to her weight loss journey.  BP Readings from Last 3 Encounters:  06/14/20 (!) 141/83  05/05/20 125/77  04/13/20 132/79   Lab Results  Component Value Date   CREATININE 0.68 05/14/2019   - Comprehensive metabolic panel  7. Anemia Nutrition: Iron-rich foods include dark leafy greens, red and white meats, eggs, seafood, and beans.  Certain foods and drinks prevent your body from absorbing iron properly. Avoid eating these foods in the same meal as iron-rich foods or with iron supplements. These foods include: coffee, black tea, and red wine; milk, dairy products, and foods that are high in calcium; beans and soybeans; whole grains. Constipation can be a side effect of iron supplementation. Increased water and fiber intake are helpful. Water  goal: > 2 liters/day. Fiber goal: > 25 grams/day.  - Anemia panel - CBC with Differential/Platelet  8. At risk for heart disease Tomeika was given approximately 8 minutes of coronary artery disease prevention counseling today. She is 39 y.o. female  and has risk factors for heart disease including obesity. We discussed intensive lifestyle modifications today with an emphasis on specific weight loss instructions and strategies. Repetitive spaced learning was employed today to elicit superior memory formation and behavioral change.  During insulin resistance, several metabolic alterations induce the development of cardiovascular disease. For instance, insulin resistance can induce an imbalance in glucose metabolism that generates chronic hyperglycemia, which in turn triggers oxidative stress and causes an inflammatory response that leads to cell damage. Insulin resistance can also alter systemic lipid metabolism which then leads to the development of dyslipidemia and the well-known lipid triad: (1) high levels of plasma triglycerides, (2) low levels of high-density lipoprotein, and (3) the appearance of small dense low-density lipoproteins. This triad, along with endothelial dysfunction, which can also be induced by aberrant insulin signaling, contribute to atherosclerotic plaque formation.   9. Class 3 severe obesity with serious comorbidity and body mass index (BMI) of 60.0 to 69.9 in adult, unspecified obesity type (HCC)  Course: Katerine is currently in the action stage of change. As such, her goal is to continue with weight loss efforts.   Nutrition goals: She has agreed to the Category 4 Plan.   Exercise goals: As is.  Behavioral modification strategies: increasing lean protein intake, decreasing simple carbohydrates, increasing vegetables and increasing water intake.  Ryeleigh has agreed to follow-up with our clinic in 4 weeks, fasting. She was informed of the importance of frequent follow-up visits to maximize her success with intensive lifestyle modifications for her multiple health conditions.   Julio was informed we would discuss her lab results at her next visit unless there is a critical issue that needs to be addressed sooner.  Teletha agreed to keep her next visit at the agreed upon time to discuss these results.  Objective:   Blood pressure (!) 141/83, pulse 74, temperature 98.4 F (36.9 C), temperature source Oral, height 5\' 5"  (1.651 m), weight (!) 394 lb (178.7 kg), SpO2 99 %. Body mass index is 65.57 kg/m.  General: Cooperative, alert, well developed, in no acute distress. HEENT: Conjunctivae and lids unremarkable. Cardiovascular: Regular rhythm.  Lungs: Normal work of breathing. Neurologic: No focal deficits.   Lab Results  Component Value Date   CREATININE 0.68 05/14/2019   BUN 12 05/14/2019   NA 139 05/14/2019   K 4.1 05/14/2019   CL 101 05/14/2019   CO2 25 05/14/2019   Lab Results  Component Value Date   ALT 14 05/14/2019   AST 15 05/14/2019   ALKPHOS 62 05/14/2019   BILITOT 0.4 05/14/2019   Lab Results  Component Value Date   HGBA1C 5.3 05/14/2019   Lab Results  Component Value Date   INSULIN 217.0 (H) 10/29/2019   INSULIN 18.4 05/14/2019   Lab Results  Component Value Date   TSH 2.540 05/14/2019   Lab Results  Component Value Date   CHOL 161 05/14/2019   HDL 43 05/14/2019   LDLCALC 109 (H) 05/14/2019   TRIG 44 05/14/2019   Lab Results  Component Value Date   WBC 7.7 05/14/2019   HGB 12.2 05/14/2019   HCT 38.3 10/29/2019   MCV 85 05/14/2019   PLT 279 05/14/2019   Lab Results  Component Value Date   IRON  37 10/29/2019   TIBC 294 10/29/2019   FERRITIN 179 (H) 10/29/2019   Attestation Statements:   Reviewed by clinician on day of visit: allergies, medications, problem list, medical history, surgical history, family history, social history, and previous encounter notes.  I, Insurance claims handler, CMA, am acting as transcriptionist for Helane Rima, DO  I have reviewed the above documentation for accuracy and completeness, and I agree with the above. Helane Rima, DO

## 2020-07-01 DIAGNOSIS — G4733 Obstructive sleep apnea (adult) (pediatric): Secondary | ICD-10-CM | POA: Diagnosis not present

## 2020-07-06 ENCOUNTER — Encounter (INDEPENDENT_AMBULATORY_CARE_PROVIDER_SITE_OTHER): Payer: Self-pay

## 2020-07-06 ENCOUNTER — Other Ambulatory Visit (INDEPENDENT_AMBULATORY_CARE_PROVIDER_SITE_OTHER): Payer: Self-pay | Admitting: Family Medicine

## 2020-07-06 DIAGNOSIS — Z6841 Body Mass Index (BMI) 40.0 and over, adult: Secondary | ICD-10-CM

## 2020-07-06 NOTE — Telephone Encounter (Signed)
Last OV with Dr Wallace 

## 2020-07-06 NOTE — Telephone Encounter (Signed)
Message sent to pt.

## 2020-07-14 ENCOUNTER — Ambulatory Visit (INDEPENDENT_AMBULATORY_CARE_PROVIDER_SITE_OTHER): Payer: BC Managed Care – PPO | Admitting: Family Medicine

## 2020-07-21 ENCOUNTER — Other Ambulatory Visit (INDEPENDENT_AMBULATORY_CARE_PROVIDER_SITE_OTHER): Payer: Self-pay | Admitting: Family Medicine

## 2020-07-21 DIAGNOSIS — Z6841 Body Mass Index (BMI) 40.0 and over, adult: Secondary | ICD-10-CM

## 2020-07-21 MED ORDER — INSULIN PEN NEEDLE 32G X 4 MM MISC: 1.0000 | Freq: Every day | 0 refills | Status: DC

## 2020-07-21 MED ORDER — SAXENDA 18 MG/3ML ~~LOC~~ SOPN: 3.0000 mg | PEN_INJECTOR | Freq: Every day | SUBCUTANEOUS | 0 refills | Status: DC

## 2020-07-21 NOTE — Telephone Encounter (Signed)
This patient was last seen by Dr. Earlene Plater, and currently has an upcoming appt scheduled on 07/28/20 with her.

## 2020-07-28 ENCOUNTER — Ambulatory Visit (INDEPENDENT_AMBULATORY_CARE_PROVIDER_SITE_OTHER): Payer: BC Managed Care – PPO | Admitting: Family Medicine

## 2020-07-28 ENCOUNTER — Encounter (INDEPENDENT_AMBULATORY_CARE_PROVIDER_SITE_OTHER): Payer: Self-pay | Admitting: Family Medicine

## 2020-07-28 ENCOUNTER — Other Ambulatory Visit: Payer: Self-pay

## 2020-07-28 VITALS — BP 140/72 | HR 76 | Temp 98.1°F | Ht 65.0 in | Wt 394.0 lb

## 2020-07-28 DIAGNOSIS — E65 Localized adiposity: Secondary | ICD-10-CM

## 2020-07-28 DIAGNOSIS — E8881 Metabolic syndrome: Secondary | ICD-10-CM | POA: Diagnosis not present

## 2020-07-28 DIAGNOSIS — E559 Vitamin D deficiency, unspecified: Secondary | ICD-10-CM

## 2020-07-28 DIAGNOSIS — Z9189 Other specified personal risk factors, not elsewhere classified: Secondary | ICD-10-CM

## 2020-07-28 DIAGNOSIS — G4733 Obstructive sleep apnea (adult) (pediatric): Secondary | ICD-10-CM

## 2020-07-28 DIAGNOSIS — Z9989 Dependence on other enabling machines and devices: Secondary | ICD-10-CM

## 2020-07-28 DIAGNOSIS — Z6841 Body Mass Index (BMI) 40.0 and over, adult: Secondary | ICD-10-CM

## 2020-07-29 NOTE — Progress Notes (Signed)
Chief Complaint:   OBESITY Elizabeth Escobar is here to discuss her progress with her obesity treatment plan along with follow-up of her obesity related diagnoses.   Today's visit was #: 20 Starting weight: 426 lbs Starting date: 05/14/2019 Today's weight: 394 lbs Today's date: 07/28/2020 Total lbs lost to date: 32 lbs Body mass index is 65.57 kg/m.  Total weight loss percentage to date: -7.51%  Interim History: Elizabeth Escobar is on Saxenda 1.8 mg subcutaneously daily. Nutrition Plan: the Category 4 Plan for 75% of the time.  Anti-obesity medications: Saxenda 1.8 mg subcutaneously daily. Reported side effects: None. Activity: Strength training for 30 minutes 5-6 times per week.  Assessment/Plan:   1. Metabolic syndrome Will increase Saxenda to 2.4 mg subcutaneously daily.  Starting goal: Lose 7-10% of starting weight. She will continue to focus on protein-rich, low simple carbohydrate foods. We reviewed the importance of hydration, regular exercise for stress reduction, and restorative sleep.  We will continue to check lab work every 3 months, with 10% weight loss, or should any other concerns arise.  2. OSA on CPAP OSA is a cause of systemic hypertension and is associated with an increased incidence of stroke, heart failure, atrial fibrillation, and coronary heart disease. Severe OSA increases all-cause mortality and  cardiovascular mortality.   Goal: Treatment of OSA via CPAP compliance and weight loss. . Plasma ghrelin levels (appetite or "hunger hormone") are significantly higher in OSA patients than in BMI-matched controls, but decrease to levels similar to those of obese patients without OSA after CPAP treatment.  . Weight loss improves OSA by several mechanisms, including reduction in fatty tissue in the throat (i.e. parapharyngeal fat) and the tongue. Loss of abdominal fat increases mediastinal traction on the upper airway making it less likely to collapse during sleep. . Studies have  also shown that compliance with CPAP treatment improves leptin (hunger inhibitory hormone) imbalance.  3. Visceral obesity Current visceral fat rating: 30. Visceral fat rating should be < 13. Visceral adipose tissue is a hormonally active component of total body fat. This body composition phenotype is associated with medical disorders such as metabolic syndrome, cardiovascular disease and several malignancies including prostate, breast, and colorectal cancers. Starting goal: Lose 7-10% of starting weight.   4. Vitamin D deficiency Not at goal. Current vitamin D is 27.4, tested on 06/14/2020. Optimal goal > 50 ng/dL.   Plan:  [x]   Continue daily multivitamin. []   Continue home supplement daily. [x]   Follow-up for routine testing of Vitamin D at least 2-3 times per year to avoid over-replacement.  5. At risk for activity intolerance Elizabeth Escobar was given approximately 9 minutes of exercise intolerance counseling today. She is 40 y.o. female and has risk factors exercise intolerance including obesity. We discussed intensive lifestyle modifications today with an emphasis on specific weight loss instructions and strategies. Elizabeth Escobar will slowly increase activity as tolerated.  6. Class 3 severe obesity with serious comorbidity and body mass index (BMI) of 60.0 to 69.9 in adult, unspecified obesity type (HCC)  Course: Elizabeth Escobar is currently in the action stage of change. As such, her goal is to continue with weight loss efforts.   Nutrition goals: She has agreed to keeping a food journal and adhering to recommended goals of 1500-1800 calories and 95 grams of protein.   Exercise goals: As is.  Add walking if tolerated.  Behavioral modification strategies: increasing lean protein intake, increasing water intake and meal planning and cooking strategies.  Elizabeth Escobar has agreed to follow-up with our  clinic in 4 weeks. She was informed of the importance of frequent follow-up visits to maximize her success with  intensive lifestyle modifications for her multiple health conditions.   Objective:   Blood pressure 140/72, pulse 76, temperature 98.1 F (36.7 C), temperature source Oral, height 5\' 5"  (1.651 m), weight (!) 394 lb (178.7 kg), SpO2 94 %. Body mass index is 65.57 kg/m.  General: Cooperative, alert, well developed, in no acute distress. HEENT: Conjunctivae and lids unremarkable. Cardiovascular: Regular rhythm.  Lungs: Normal work of breathing. Neurologic: No focal deficits.   Lab Results  Component Value Date   CREATININE 0.56 (L) 06/14/2020   BUN 14 06/14/2020   NA 138 06/14/2020   K 4.2 06/14/2020   CL 102 06/14/2020   CO2 24 06/14/2020   Lab Results  Component Value Date   ALT 19 06/14/2020   AST 12 06/14/2020   ALKPHOS 58 06/14/2020   BILITOT 0.4 06/14/2020   Lab Results  Component Value Date   HGBA1C 5.3 05/14/2019   Lab Results  Component Value Date   INSULIN 28.3 (H) 06/14/2020   INSULIN 217.0 (H) 10/29/2019   INSULIN 18.4 05/14/2019   Lab Results  Component Value Date   TSH 2.540 05/14/2019   Lab Results  Component Value Date   CHOL 161 06/14/2020   HDL 47 06/14/2020   LDLCALC 102 (H) 06/14/2020   TRIG 57 06/14/2020   CHOLHDL 3.4 06/14/2020   Lab Results  Component Value Date   WBC 7.7 06/14/2020   HGB 12.6 06/14/2020   HCT 38.4 06/14/2020   MCV 89 06/14/2020   PLT 284 06/14/2020   Lab Results  Component Value Date   IRON 36 06/14/2020   TIBC 332 06/14/2020   FERRITIN 142 06/14/2020   Attestation Statements:   Reviewed by clinician on day of visit: allergies, medications, problem list, medical history, surgical history, family history, social history, and previous encounter notes.  I, 06/16/2020, CMA, am acting as transcriptionist for Insurance claims handler, DO  I have reviewed the above documentation for accuracy and completeness, and I agree with the above. Helane Rima, DO

## 2020-08-01 DIAGNOSIS — G4733 Obstructive sleep apnea (adult) (pediatric): Secondary | ICD-10-CM | POA: Diagnosis not present

## 2020-08-12 ENCOUNTER — Other Ambulatory Visit (INDEPENDENT_AMBULATORY_CARE_PROVIDER_SITE_OTHER): Payer: Self-pay | Admitting: Family Medicine

## 2020-08-12 DIAGNOSIS — Z6841 Body Mass Index (BMI) 40.0 and over, adult: Secondary | ICD-10-CM

## 2020-08-12 NOTE — Telephone Encounter (Signed)
Dr.Wallace °

## 2020-08-26 ENCOUNTER — Ambulatory Visit (INDEPENDENT_AMBULATORY_CARE_PROVIDER_SITE_OTHER): Payer: BC Managed Care – PPO | Admitting: Family Medicine

## 2020-08-26 ENCOUNTER — Encounter (INDEPENDENT_AMBULATORY_CARE_PROVIDER_SITE_OTHER): Payer: Self-pay | Admitting: Family Medicine

## 2020-08-26 ENCOUNTER — Other Ambulatory Visit: Payer: Self-pay

## 2020-08-26 VITALS — BP 129/85 | HR 70 | Temp 98.6°F | Ht 65.0 in | Wt 395.0 lb

## 2020-08-26 DIAGNOSIS — Z6841 Body Mass Index (BMI) 40.0 and over, adult: Secondary | ICD-10-CM

## 2020-08-26 DIAGNOSIS — R632 Polyphagia: Secondary | ICD-10-CM | POA: Diagnosis not present

## 2020-08-26 DIAGNOSIS — E8881 Metabolic syndrome: Secondary | ICD-10-CM

## 2020-08-26 DIAGNOSIS — G4733 Obstructive sleep apnea (adult) (pediatric): Secondary | ICD-10-CM

## 2020-08-26 DIAGNOSIS — Z9189 Other specified personal risk factors, not elsewhere classified: Secondary | ICD-10-CM

## 2020-08-26 DIAGNOSIS — Z9989 Dependence on other enabling machines and devices: Secondary | ICD-10-CM

## 2020-08-26 MED ORDER — WEGOVY 0.5 MG/0.5ML ~~LOC~~ SOAJ: 0.5000 mg | SUBCUTANEOUS | 0 refills | Status: DC

## 2020-08-30 DIAGNOSIS — G4733 Obstructive sleep apnea (adult) (pediatric): Secondary | ICD-10-CM | POA: Diagnosis not present

## 2020-08-31 NOTE — Progress Notes (Signed)
Chief Complaint:   OBESITY Elizabeth Escobar is here to discuss her progress with her obesity treatment plan along with follow-up of her obesity related diagnoses.   Today's visit was #: 21 Starting weight: 426 lbs Starting date: 05/14/2019 Today's weight: 395 lbs Today's date: 08/26/2020 Total lbs lost to date: 31 lbs Body mass index is 65.73 kg/m.  Total weight loss percentage to date: -7.28%  Interim History:  Elizabeth Escobar has been on Saxenda 2.4 mg subcutaneously daily for the last week.  She is tolerating it well.  She has 2 pens left. Current Meal Plan: the Category 4 Plan for 75% of the time.  Current Exercise Plan: Strength training for 30 minutes 5 times per week. Current Anti-Obesity Medications: Saxenda 2.4 mg subcutaneously daily. Side effects: None.  Assessment/Plan:   1. Polyphagia Current treatment: Saxenda 2.4 mg subcutaneously daily. Polyphagia refers to excessive feelings of hunger. She will continue to focus on protein-rich, low simple carbohydrate foods. We reviewed the importance of hydration, regular exercise for stress reduction, and restorative sleep.  - Start Semaglutide-Weight Management (WEGOVY) 0.5 MG/0.5ML SOAJ; Inject 0.5 mg into the skin once a week.  Dispense: 2 mL; Refill: 0  2. Metabolic syndrome Starting goal: Lose 7-10% of starting weight. She will continue to focus on protein-rich, low simple carbohydrate foods. We reviewed the importance of hydration, regular exercise for stress reduction, and restorative sleep.  We will continue to check lab work every 3 months, with 10% weight loss, or should any other concerns arise.  3. OSA on CPAP OSA is a cause of systemic hypertension and is associated with an increased incidence of stroke, heart failure, atrial fibrillation, and coronary heart disease. Severe OSA increases all-cause mortality and  cardiovascular mortality.   Goal: Treatment of OSA via CPAP compliance and weight loss. . Plasma ghrelin levels  (appetite or "hunger hormone") are significantly higher in OSA patients than in BMI-matched controls, but decrease to levels similar to those of obese patients without OSA after CPAP treatment.  . Weight loss improves OSA by several mechanisms, including reduction in fatty tissue in the throat (i.e. parapharyngeal fat) and the tongue. Loss of abdominal fat increases mediastinal traction on the upper airway making it less likely to collapse during sleep. . Studies have also shown that compliance with CPAP treatment improves leptin (hunger inhibitory hormone) imbalance.  4. At risk for heart disease Due to Elizabeth Escobar's current state of health and medical condition(s), she is at a higher risk for heart disease.  This puts the patient at much greater risk to subsequently develop cardiopulmonary conditions that can significantly affect patient's quality of life in a negative manner.    At least 10 minutes were spent on counseling Elizabeth Escobar about these concerns today, and I stressed the importance of reversing risks factors of obesity, especially truncal and visceral fat, hypertension, hyperlipidemia, and pre-diabetes.  The initial goal is to lose at least 5-10% of starting weight to help reduce these risk factors.  Counseling:  Intensive lifestyle modifications were discussed with Elizabeth Escobar as the most appropriate first line of treatment.  she will continue to work on diet, exercise, and weight loss efforts.  We will continue to reassess these conditions on a fairly regular basis in an attempt to decrease the patient's overall morbidity and mortality.  Evidence-based interventions for health behavior change were utilized today including the discussion of self monitoring techniques, problem-solving barriers, and SMART goal setting techniques.  Specifically, regarding patient's less desirable eating habits and patterns, we  employed the technique of small changes when Elizabeth Escobar has not been able to fully commit to her  prudent nutritional plan.  5. Class 3 severe obesity with serious comorbidity and body mass index (BMI) of 60.0 to 69.9 in adult, unspecified obesity type (HCC)  Course: Elizabeth Escobar is currently in the action stage of change. As such, her goal is to continue with weight loss efforts.   Nutrition goals: She has agreed to the Category 4 Plan.   Exercise goals: As is.  Increase weight/reps.  Behavioral modification strategies: increasing lean protein intake, decreasing simple carbohydrates, increasing vegetables and increasing water intake.  Elizabeth Escobar has agreed to follow-up with our clinic in 4 weeks. She was informed of the importance of frequent follow-up visits to maximize her success with intensive lifestyle modifications for her multiple health conditions.   Objective:   Blood pressure 129/85, pulse 70, temperature 98.6 F (37 C), temperature source Oral, height 5\' 5"  (1.651 m), weight (!) 395 lb (179.2 kg), SpO2 99 %. Body mass index is 65.73 kg/m.  General: Cooperative, alert, well developed, in no acute distress. HEENT: Conjunctivae and lids unremarkable. Cardiovascular: Regular rhythm.  Lungs: Normal work of breathing. Neurologic: No focal deficits.   Lab Results  Component Value Date   CREATININE 0.56 (L) 06/14/2020   BUN 14 06/14/2020   NA 138 06/14/2020   K 4.2 06/14/2020   CL 102 06/14/2020   CO2 24 06/14/2020   Lab Results  Component Value Date   ALT 19 06/14/2020   AST 12 06/14/2020   ALKPHOS 58 06/14/2020   BILITOT 0.4 06/14/2020   Lab Results  Component Value Date   HGBA1C 5.3 05/14/2019   Lab Results  Component Value Date   INSULIN 28.3 (H) 06/14/2020   INSULIN 217.0 (H) 10/29/2019   INSULIN 18.4 05/14/2019   Lab Results  Component Value Date   TSH 2.540 05/14/2019   Lab Results  Component Value Date   CHOL 161 06/14/2020   HDL 47 06/14/2020   LDLCALC 102 (H) 06/14/2020   TRIG 57 06/14/2020   CHOLHDL 3.4 06/14/2020   Lab Results  Component  Value Date   WBC 7.7 06/14/2020   HGB 12.6 06/14/2020   HCT 38.4 06/14/2020   MCV 89 06/14/2020   PLT 284 06/14/2020   Lab Results  Component Value Date   IRON 36 06/14/2020   TIBC 332 06/14/2020   FERRITIN 142 06/14/2020   Attestation Statements:   Reviewed by clinician on day of visit: allergies, medications, problem list, medical history, surgical history, family history, social history, and previous encounter notes.  I, 06/16/2020, CMA, am acting as transcriptionist for Insurance claims handler, DO  I have reviewed the above documentation for accuracy and completeness, and I agree with the above. Helane Rima, DO

## 2020-09-08 ENCOUNTER — Encounter (INDEPENDENT_AMBULATORY_CARE_PROVIDER_SITE_OTHER): Payer: Self-pay

## 2020-09-29 DIAGNOSIS — G4733 Obstructive sleep apnea (adult) (pediatric): Secondary | ICD-10-CM | POA: Diagnosis not present

## 2020-10-04 ENCOUNTER — Other Ambulatory Visit: Payer: Self-pay

## 2020-10-04 ENCOUNTER — Encounter (INDEPENDENT_AMBULATORY_CARE_PROVIDER_SITE_OTHER): Payer: Self-pay | Admitting: Family Medicine

## 2020-10-04 ENCOUNTER — Ambulatory Visit (INDEPENDENT_AMBULATORY_CARE_PROVIDER_SITE_OTHER): Payer: BC Managed Care – PPO | Admitting: Family Medicine

## 2020-10-04 VITALS — BP 127/75 | HR 77 | Temp 98.5°F | Ht 65.0 in | Wt 393.0 lb

## 2020-10-04 DIAGNOSIS — R632 Polyphagia: Secondary | ICD-10-CM | POA: Diagnosis not present

## 2020-10-04 DIAGNOSIS — Z9189 Other specified personal risk factors, not elsewhere classified: Secondary | ICD-10-CM

## 2020-10-04 DIAGNOSIS — E559 Vitamin D deficiency, unspecified: Secondary | ICD-10-CM

## 2020-10-04 DIAGNOSIS — E8881 Metabolic syndrome: Secondary | ICD-10-CM

## 2020-10-04 DIAGNOSIS — E66813 Obesity, class 3: Secondary | ICD-10-CM

## 2020-10-04 DIAGNOSIS — Z6841 Body Mass Index (BMI) 40.0 and over, adult: Secondary | ICD-10-CM

## 2020-10-11 NOTE — Progress Notes (Signed)
Chief Complaint:   OBESITY Elizabeth Escobar is here to discuss her progress with her obesity treatment plan along with follow-up of her obesity related diagnoses.   Today's visit was #: 22 Starting weight: 426 lbs Starting date: 05/14/2019 Today's weight: 393 lbs Today's date: 10/11/2020 Total lbs lost to date: 33 lbs Body mass index is 65.4 kg/m.  Total weight loss percentage to date: -7.75%  Interim History:Fareeda says she is adding a third job this week. 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 She will start taking Wegovy today.  Current Meal Plan: the Category 4 Plan.  Current Exercise Plan: Strength training for 30 minutes 5 times per week.. Current Anti-Obesity Medications: Saxenda 2.4 mg subcutaneously daily. Side effects: None.  Assessment/Plan:   1. Polyphagia Current treatment:  Saxenda 2.4 mg subcutaneously daily. Polyphagia refers to excessive feelings of hunger.  Plan: Will start Wegovy 0.5 mg weekly today. She will continue to focus on protein-rich, low simple carbohydrate foods. We reviewed the importance of hydration, regular exercise for stress reduction, and restorative sleep.  2. Metabolic syndrome Starting goal: Lose 7-10% of starting weight. She will continue to focus on protein-rich, low simple carbohydrate foods. We reviewed the importance of hydration, regular exercise for stress reduction, and restorative sleep.  We will continue to check lab work every 3 months, with 10% weight loss, or should any other concerns arise.  3. Vitamin D deficiency Not at goal. Current vitamin D is 27.4, tested on 06/14/2020. Optimal goal > 50 ng/dL.   Plan: Continue current OTC multivitamin supplementation.  Follow-up for routine testing of Vitamin D, at least 2-3 times per year to avoid over-replacement.  4. At risk for nausea Shantay Migues was given approximately 9 minutes of nausea prevention counseling today. Fatiha is at risk for nausea due to her new or current  medication. She was encouraged to titrate her medication slowly, make sure to stay hydrated, eat smaller portions throughout the day, and avoid high fat meals.   5. Obesity, current BMI 65.5  Course: Darlen is currently in the action stage of change. As such, her goal is to continue with weight loss efforts.   Nutrition goals: She has agreed to the Category 4 Plan.   Exercise goals: As is.  Behavioral modification strategies: increasing lean protein intake, decreasing simple carbohydrates, increasing vegetables, increasing water intake and decreasing liquid calories.  Elizabeth Escobar has agreed to follow-up with our clinic in 4 weeks. She was informed of the importance of frequent follow-up visits to maximize her success with intensive lifestyle modifications for her multiple health conditions.   Objective:   Blood pressure 127/75, pulse 77, temperature 98.5 F (36.9 C), temperature source Oral, height 5\' 5"  (1.651 m), weight (!) 393 lb (178.3 kg), SpO2 95 %. Body mass index is 65.4 kg/m.  General: Cooperative, alert, well developed, in no acute distress. HEENT: Conjunctivae and lids unremarkable. Cardiovascular: Regular rhythm.  Lungs: Normal work of breathing. Neurologic: No focal deficits.   Lab Results  Component Value Date   CREATININE 0.56 (L) 06/14/2020   BUN 14 06/14/2020   NA 138 06/14/2020   K 4.2 06/14/2020   CL 102 06/14/2020   CO2 24 06/14/2020   Lab Results  Component Value Date   ALT 19 06/14/2020   AST 12 06/14/2020   ALKPHOS 58 06/14/2020   BILITOT 0.4 06/14/2020   Lab Results  Component Value Date   HGBA1C 5.3 05/14/2019   Lab Results  Component Value Date  INSULIN 28.3 (H) 06/14/2020   INSULIN 217.0 (H) 10/29/2019   INSULIN 18.4 05/14/2019   Lab Results  Component Value Date   TSH 2.540 05/14/2019   Lab Results  Component Value Date   CHOL 161 06/14/2020   HDL 47 06/14/2020   LDLCALC 102 (H) 06/14/2020   TRIG 57 06/14/2020   CHOLHDL 3.4  06/14/2020   Lab Results  Component Value Date   WBC 7.7 06/14/2020   HGB 12.6 06/14/2020   HCT 38.4 06/14/2020   MCV 89 06/14/2020   PLT 284 06/14/2020   Lab Results  Component Value Date   IRON 36 06/14/2020   TIBC 332 06/14/2020   FERRITIN 142 06/14/2020   Attestation Statements:   Reviewed by clinician on day of visit: allergies, medications, problem list, medical history, surgical history, family history, social history, and previous encounter notes.   Carlos Levering Friedenbach, CMA, am acting as Energy manager for [employer], DO.  I have reviewed the above documentation for accuracy and completeness, and I agree with the above. Helane Rima, DO

## 2020-10-20 ENCOUNTER — Other Ambulatory Visit (INDEPENDENT_AMBULATORY_CARE_PROVIDER_SITE_OTHER): Payer: Self-pay | Admitting: Family Medicine

## 2020-10-20 DIAGNOSIS — R632 Polyphagia: Secondary | ICD-10-CM

## 2020-10-21 NOTE — Telephone Encounter (Signed)
Pt last seen by Dr. Wallace.  

## 2020-10-25 IMAGING — DX DG FEMUR 2+V*L*
4 series · 4 of 4 positions shown · non-contrast
Comparison: None.

CLINICAL DATA: Lateral thigh pain for 1 month.  No known injury.

EXAM:
LEFT FEMUR 2 VIEWS

[femur ap_lower (1 of 2)]
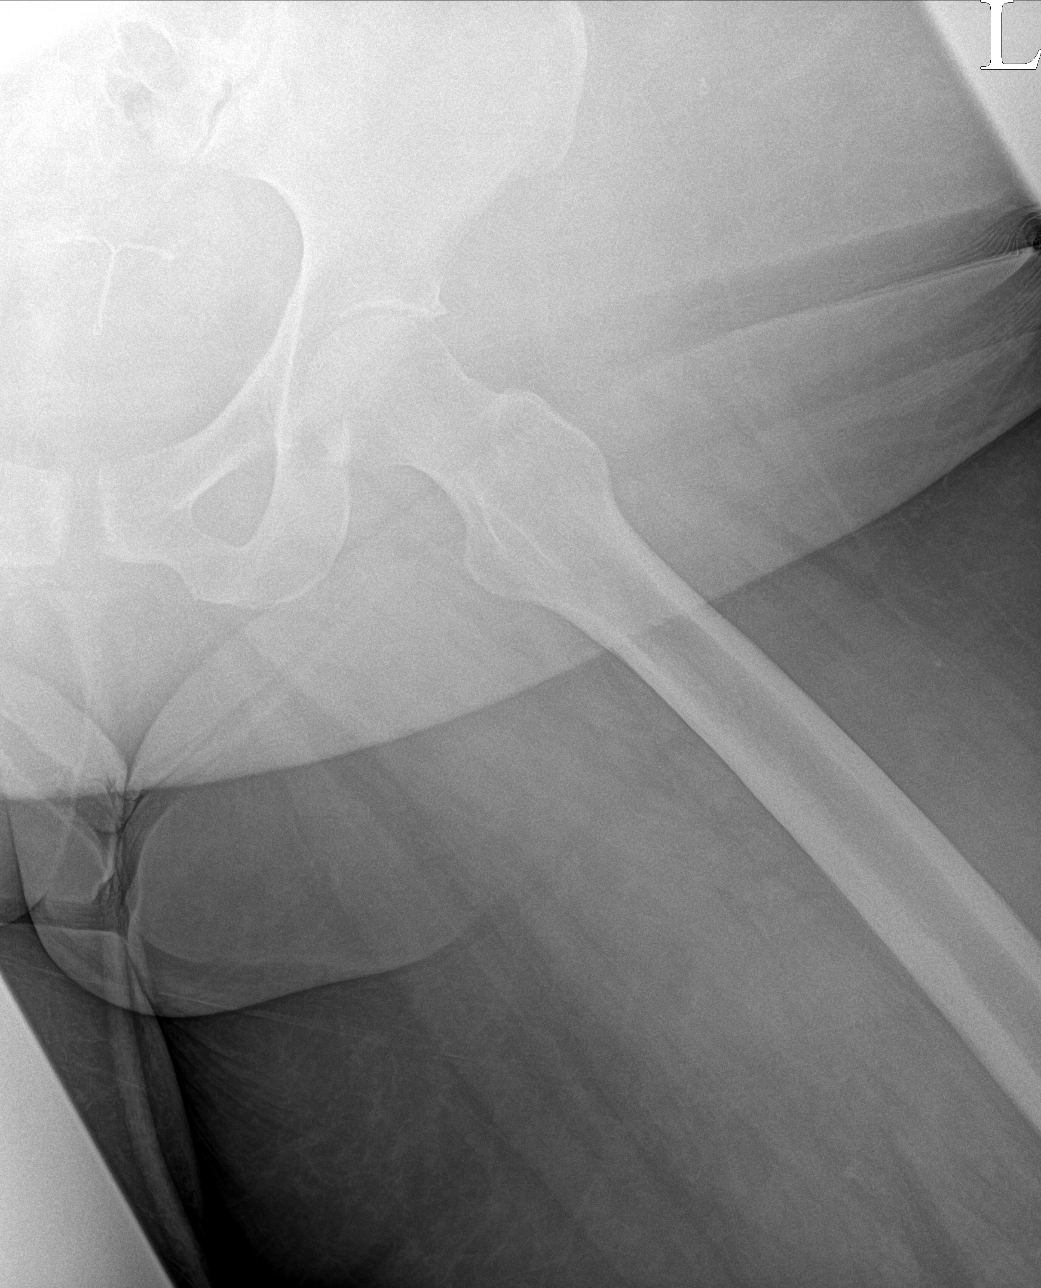

[femur ap_lower (2 of 2)]
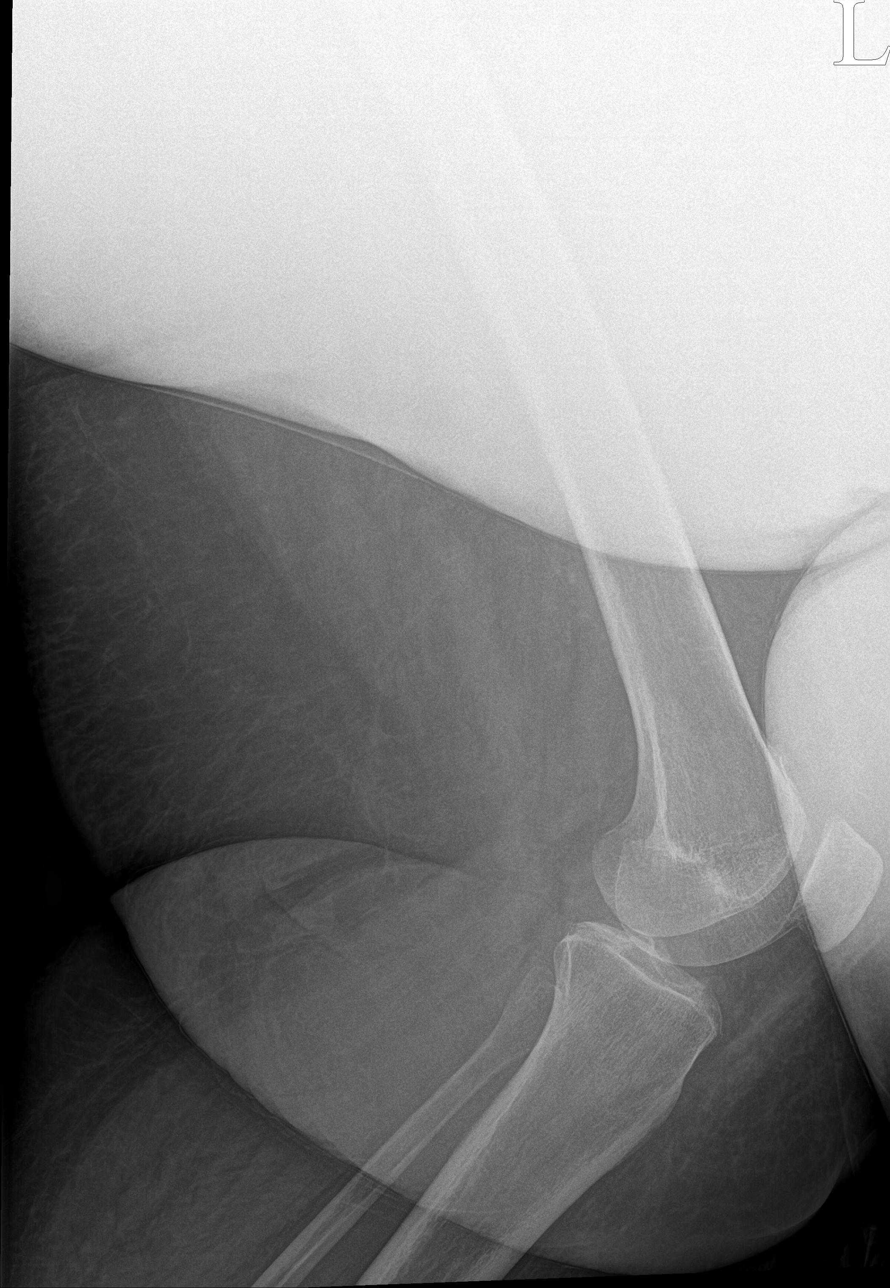

[femur lat_upper (1 of 2)]
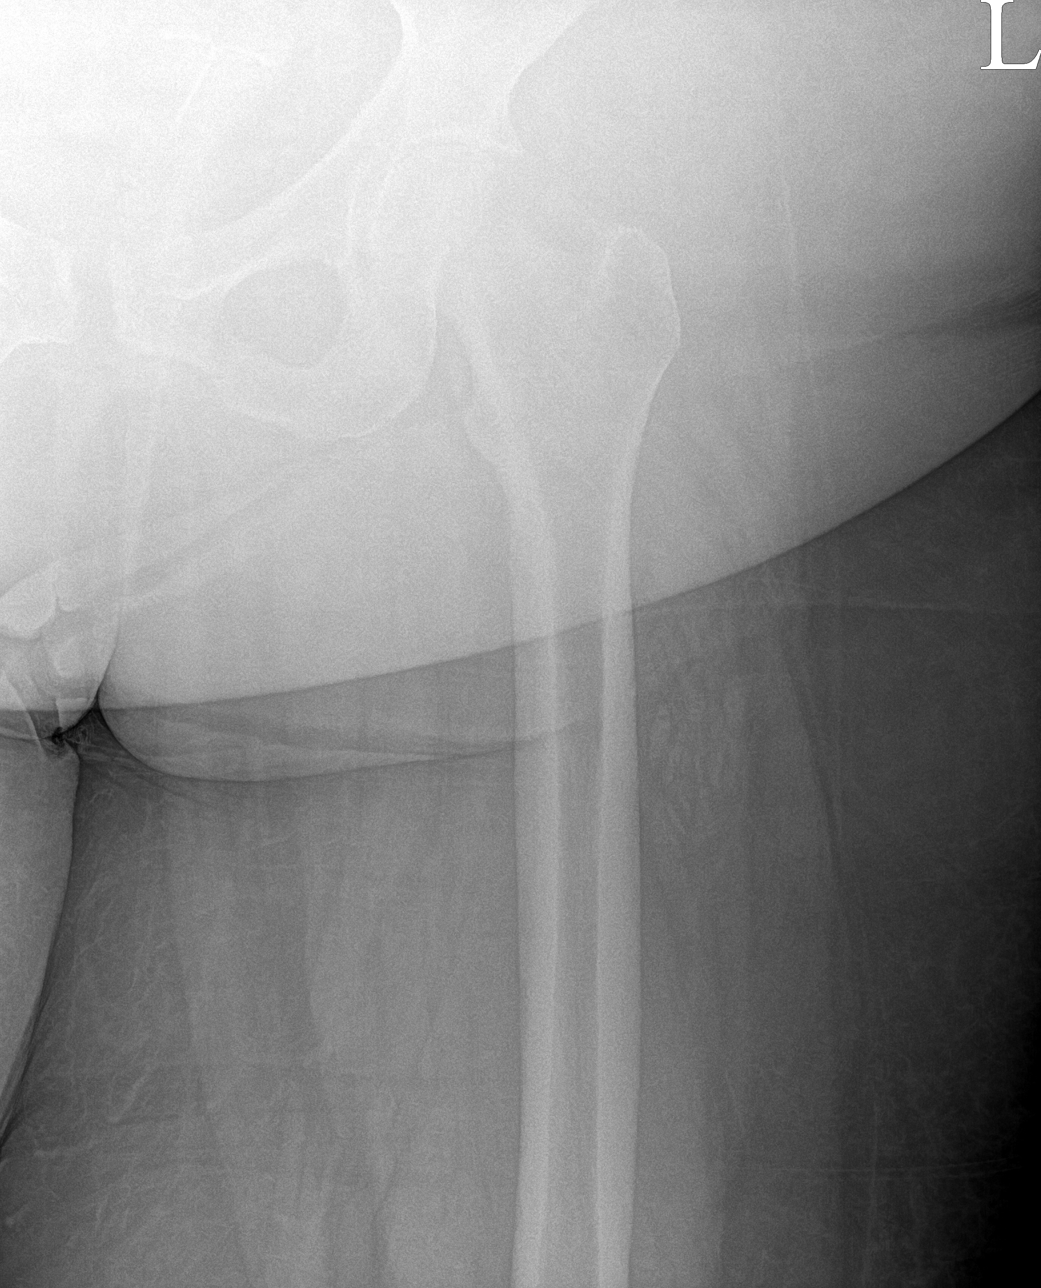

[femur lat_upper (2 of 2)]
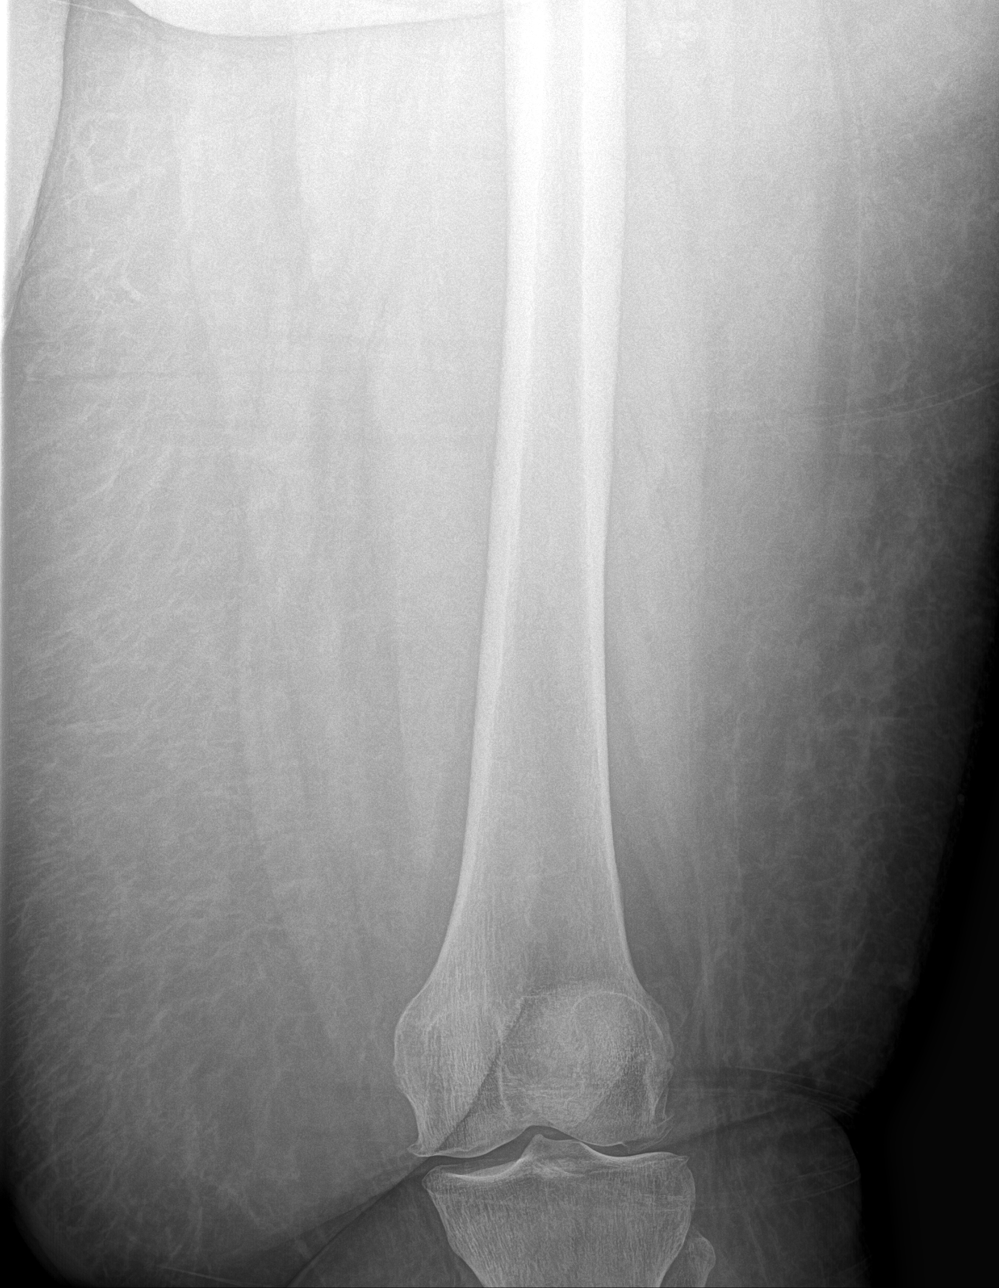

[4 of 4 positions shown; findings below may reference images not displayed]

FINDINGS: Cortical margins of the femur are intact. There is no evidence of
fracture or other focal bone lesions. No evidence of periosteal
reaction or bony destruction. Knee and hip alignment is maintained.
Osteoarthritis of the knee. There is widening of the pubic symphysis
that is likely chronic. No suspicious soft tissue abnormality,
entire soft tissues are not entirely included in the field of view
due to habitus.
IMPRESSION: 1. Unremarkable radiographs of the left femur.
2. Osteoarthritis of the knee.
3. Widening of the pubic symphysis is likely chronic.

## 2020-10-25 MED ORDER — WEGOVY 0.5 MG/0.5ML ~~LOC~~ SOAJ: 0.5000 mg | SUBCUTANEOUS | 0 refills | Status: DC

## 2020-10-30 DIAGNOSIS — G4733 Obstructive sleep apnea (adult) (pediatric): Secondary | ICD-10-CM | POA: Diagnosis not present

## 2020-11-02 ENCOUNTER — Encounter (INDEPENDENT_AMBULATORY_CARE_PROVIDER_SITE_OTHER): Payer: Self-pay | Admitting: Family Medicine

## 2020-11-03 NOTE — Telephone Encounter (Signed)
Pt last seen by Dr. Wallace.  

## 2020-11-10 ENCOUNTER — Ambulatory Visit (INDEPENDENT_AMBULATORY_CARE_PROVIDER_SITE_OTHER): Payer: BC Managed Care – PPO | Admitting: Family Medicine

## 2020-11-10 DIAGNOSIS — Z6841 Body Mass Index (BMI) 40.0 and over, adult: Secondary | ICD-10-CM | POA: Diagnosis not present

## 2020-11-10 DIAGNOSIS — Z1231 Encounter for screening mammogram for malignant neoplasm of breast: Secondary | ICD-10-CM | POA: Diagnosis not present

## 2020-11-10 DIAGNOSIS — Z0000 Encounter for general adult medical examination without abnormal findings: Secondary | ICD-10-CM | POA: Diagnosis not present

## 2020-11-10 DIAGNOSIS — D649 Anemia, unspecified: Secondary | ICD-10-CM | POA: Diagnosis not present

## 2020-11-10 DIAGNOSIS — D508 Other iron deficiency anemias: Secondary | ICD-10-CM | POA: Diagnosis not present

## 2020-11-29 DIAGNOSIS — G4733 Obstructive sleep apnea (adult) (pediatric): Secondary | ICD-10-CM | POA: Diagnosis not present

## 2020-12-10 DIAGNOSIS — Z1231 Encounter for screening mammogram for malignant neoplasm of breast: Secondary | ICD-10-CM | POA: Diagnosis not present

## 2020-12-13 ENCOUNTER — Ambulatory Visit (INDEPENDENT_AMBULATORY_CARE_PROVIDER_SITE_OTHER): Payer: BC Managed Care – PPO | Admitting: Family Medicine

## 2020-12-13 ENCOUNTER — Other Ambulatory Visit: Payer: Self-pay

## 2020-12-13 ENCOUNTER — Encounter (INDEPENDENT_AMBULATORY_CARE_PROVIDER_SITE_OTHER): Payer: Self-pay | Admitting: Family Medicine

## 2020-12-13 VITALS — BP 137/71 | HR 79 | Temp 98.5°F | Ht 65.0 in | Wt >= 6400 oz

## 2020-12-13 DIAGNOSIS — R600 Localized edema: Secondary | ICD-10-CM

## 2020-12-13 DIAGNOSIS — Z6841 Body Mass Index (BMI) 40.0 and over, adult: Secondary | ICD-10-CM

## 2020-12-13 DIAGNOSIS — R7301 Impaired fasting glucose: Secondary | ICD-10-CM

## 2020-12-13 DIAGNOSIS — M79652 Pain in left thigh: Secondary | ICD-10-CM

## 2020-12-13 DIAGNOSIS — Z9189 Other specified personal risk factors, not elsewhere classified: Secondary | ICD-10-CM | POA: Diagnosis not present

## 2020-12-14 ENCOUNTER — Ambulatory Visit: Payer: BC Managed Care – PPO | Admitting: Adult Health

## 2020-12-14 MED ORDER — OZEMPIC (0.25 OR 0.5 MG/DOSE) 2 MG/1.5ML ~~LOC~~ SOPN: 0.2500 mg | PEN_INJECTOR | SUBCUTANEOUS | 0 refills | Status: DC

## 2020-12-15 NOTE — Progress Notes (Signed)
Chief Complaint:   OBESITY Elizabeth Escobar is here to discuss her progress with her obesity treatment plan along with follow-up of her obesity related diagnoses.   Today's visit was #: 23 Starting weight: 426 lbs Starting date: 05/14/2019 Today's weight: 400 lbs Today's date: 12/13/2020 Weight change since last visit: +7lbs Total lbs lost to date: -26 Body mass index is 66.56 kg/m.  Total weight loss percentage to date: 6.10%  Interim History:Elizabeth Escobar reports that she has not been able to get her Wegovy in a month and a half because of supply issues.  Current Meal Plan: the Category 4 Plan for 75% of the time.  Current Exercise Plan: Elizabeth Escobar has not been getting much exercise at this time due to working three jobs. Current Anti-Obesity Medications: None at this time.   Assessment/Plan:   1. Impaired fasting glucose During insulin resistance, several metabolic alterations induce the development of cardiovascular disease. For instance, insulin resistance can induce an imbalance in glucose metabolism that generates chronic hyperglycemia, which in turn triggers oxidative stress and causes an inflammatory response that leads to cell damage. Insulin resistance can also alter systemic lipid metabolism which then leads to the development of dyslipidemia and the well-known lipid triad: (1) high levels of plasma triglycerides, (2) low levels of high-density lipoprotein, and (3) the appearance of small dense low-density lipoproteins. This triad, along with endothelial dysfunction, which can also be induced by aberrant insulin signaling, contribute to atherosclerotic plaque formation.   Start medications as per below: - Start: Semaglutide,0.25 or 0.5MG /DOS, (OZEMPIC, 0.25 OR 0.5 MG/DOSE,) 2 MG/1.5ML SOPN; Inject 0.25 mg into the skin every 7 (seven) days.  Dispense: 1.5 mL; Refill: 0  2. Lower extremity edema Elizabeth Escobar is having some fluid retention in her lower extremities. Until next visit, she has  been instructed to increase her fluid intake, and we may consider a diuretic if problems persist.  3. Left thigh pain Elizabeth Escobar is having some left thigh pain that she reports is worsening. At her next visit, if this has not resolved, we will consider a sport medicine referral.   4. At risk for heart disease Due to Elizabeth Escobar's current state of health and medical condition(s), she is at a higher risk for heart disease.  This puts the patient at much greater risk to subsequently develop cardiopulmonary conditions that can significantly affect patient's quality of life in a negative manner.    At least 9 minutes were spent on counseling Elizabeth Escobar about these concerns today. Evidence-based interventions for health behavior change were utilized today including the discussion of self monitoring techniques, problem-solving barriers, and SMART goal setting techniques.  Specifically, regarding patient's less desirable eating habits and patterns, we employed the technique of small changes when Elizabeth Escobar has not been able to fully commit to her prudent nutritional plan.  5. Obesity with current BMI of 66.7  Course: Elizabeth Escobar is currently in the action stage of change. As such, her goal is to continue with weight loss efforts.   Nutrition goals: She has agreed to the Category 4 Plan.   Exercise goals: All adults should avoid inactivity. Some physical activity is better than none, and adults who participate in any amount of physical activity gain some health benefits.  Behavioral modification strategies: increasing lean protein intake, decreasing simple carbohydrates, increasing vegetables, and increasing water intake.  Elizabeth Escobar has agreed to follow-up with our clinic in 3 weeks. She was informed of the importance of frequent follow-up visits to maximize her success with intensive lifestyle modifications  for her multiple health conditions.   Objective:   Blood pressure 137/71, pulse 79, temperature 98.5 F (36.9  C), height 5\' 5"  (1.651 m), weight (!) 400 lb (181.4 kg), SpO2 99 %. Body mass index is 66.56 kg/m.  General: Cooperative, alert, well developed, in no acute distress. HEENT: Conjunctivae and lids unremarkable. Cardiovascular: Regular rhythm.  Lungs: Normal work of breathing. Neurologic: No focal deficits.   Lab Results  Component Value Date   CREATININE 0.56 (L) 06/14/2020   BUN 14 06/14/2020   NA 138 06/14/2020   K 4.2 06/14/2020   CL 102 06/14/2020   CO2 24 06/14/2020   Lab Results  Component Value Date   ALT 19 06/14/2020   AST 12 06/14/2020   ALKPHOS 58 06/14/2020   BILITOT 0.4 06/14/2020   Lab Results  Component Value Date   HGBA1C 5.3 05/14/2019   Lab Results  Component Value Date   INSULIN 28.3 (H) 06/14/2020   INSULIN 217.0 (H) 10/29/2019   INSULIN 18.4 05/14/2019   Lab Results  Component Value Date   TSH 2.540 05/14/2019   Lab Results  Component Value Date   CHOL 161 06/14/2020   HDL 47 06/14/2020   LDLCALC 102 (H) 06/14/2020   TRIG 57 06/14/2020   CHOLHDL 3.4 06/14/2020   Lab Results  Component Value Date   WBC 7.7 06/14/2020   HGB 12.6 06/14/2020   HCT 38.4 06/14/2020   MCV 89 06/14/2020   PLT 284 06/14/2020   Lab Results  Component Value Date   IRON 36 06/14/2020   TIBC 332 06/14/2020   FERRITIN 142 06/14/2020   Attestation Statements:   Reviewed by clinician on day of visit: allergies, medications, problem list, medical history, surgical history, family history, social history, and previous encounter notes.  I, Noah Mincy,LPN am acting as 06/16/2020 for Energy manager, DO.  I have reviewed the above documentation for accuracy and completeness, and I agree with the above. [employer], DO

## 2020-12-20 DIAGNOSIS — D649 Anemia, unspecified: Secondary | ICD-10-CM | POA: Diagnosis not present

## 2020-12-30 DIAGNOSIS — G4733 Obstructive sleep apnea (adult) (pediatric): Secondary | ICD-10-CM | POA: Diagnosis not present

## 2021-01-17 ENCOUNTER — Ambulatory Visit (INDEPENDENT_AMBULATORY_CARE_PROVIDER_SITE_OTHER): Payer: BC Managed Care – PPO | Admitting: Family Medicine

## 2021-01-17 ENCOUNTER — Encounter (INDEPENDENT_AMBULATORY_CARE_PROVIDER_SITE_OTHER): Payer: Self-pay

## 2021-01-29 DIAGNOSIS — G4733 Obstructive sleep apnea (adult) (pediatric): Secondary | ICD-10-CM | POA: Diagnosis not present

## 2021-02-07 DIAGNOSIS — Z20822 Contact with and (suspected) exposure to covid-19: Secondary | ICD-10-CM | POA: Diagnosis not present

## 2021-02-07 DIAGNOSIS — R058 Other specified cough: Secondary | ICD-10-CM | POA: Diagnosis not present

## 2021-02-16 ENCOUNTER — Encounter (INDEPENDENT_AMBULATORY_CARE_PROVIDER_SITE_OTHER): Payer: Self-pay | Admitting: Family Medicine

## 2021-02-16 ENCOUNTER — Ambulatory Visit (INDEPENDENT_AMBULATORY_CARE_PROVIDER_SITE_OTHER): Payer: BC Managed Care – PPO | Admitting: Family Medicine

## 2021-02-16 ENCOUNTER — Other Ambulatory Visit: Payer: Self-pay

## 2021-02-16 VITALS — BP 123/77 | HR 97 | Temp 99.2°F | Ht 65.0 in | Wt >= 6400 oz

## 2021-02-16 DIAGNOSIS — R7301 Impaired fasting glucose: Secondary | ICD-10-CM | POA: Diagnosis not present

## 2021-02-16 DIAGNOSIS — G4733 Obstructive sleep apnea (adult) (pediatric): Secondary | ICD-10-CM

## 2021-02-16 DIAGNOSIS — Z9189 Other specified personal risk factors, not elsewhere classified: Secondary | ICD-10-CM

## 2021-02-16 DIAGNOSIS — Z6841 Body Mass Index (BMI) 40.0 and over, adult: Secondary | ICD-10-CM

## 2021-02-16 DIAGNOSIS — E8881 Metabolic syndrome: Secondary | ICD-10-CM

## 2021-02-16 DIAGNOSIS — Z9989 Dependence on other enabling machines and devices: Secondary | ICD-10-CM

## 2021-02-21 NOTE — Progress Notes (Signed)
Chief Complaint:   OBESITY Elizabeth Escobar is here to discuss her progress with her obesity treatment plan along with follow-up of her obesity related diagnoses.   Today's visit was #: 24 Starting weight: 426 lbs Starting date: 05/14/2019 Today's weight: 404 lbs Today's date: 02/16/2021 Weight change since last visit: +4 lbs Total lbs lost to date: 22 lbs Body mass index is 67.23 kg/m.  Total weight loss percentage to date: -5.16%  Current Meal Plan: the Category 4 Plan for 75% of the time.  Current Exercise Plan: Walking. Current Anti-Obesity Medications: Ozempic 0.25 mg subcutaneously weekly. Side effects: None.  Interim History:  Elizabeth Escobar says she keeps forgetting to take her Ozempic.  She endorses increased back/leg pain when standing.  She is requesting a note for work.  Assessment/Plan:   1. Impaired fasting glucose Continue Ozempic 0.25 mg subcutaneously weekly. She will continue to focus on protein-rich, low simple carbohydrate foods. We reviewed the importance of hydration, regular exercise for stress reduction, and restorative sleep.   2. Metabolic syndrome Starting goal: Lose 7-10% of starting weight. She will continue to focus on protein-rich, low simple carbohydrate foods. We reviewed the importance of hydration, regular exercise for stress reduction, and restorative sleep.  We will continue to check lab work every 3 months, with 10% weight loss, or should any other concerns arise.  3. OSA on CPAP OSA is a cause of systemic hypertension and is associated with an increased incidence of stroke, heart failure, atrial fibrillation, and coronary heart disease. Severe OSA increases all-cause mortality and cardiovascular mortality.   Goal: Treatment of OSA via CPAP compliance and weight loss. Plasma ghrelin levels (appetite or "hunger hormone") are significantly higher in OSA patients than in BMI-matched controls, but decrease to levels similar to those of obese patients without  OSA after CPAP treatment.  Weight loss improves OSA by several mechanisms, including reduction in fatty tissue in the throat (i.e. parapharyngeal fat) and the tongue. Loss of abdominal fat increases mediastinal traction on the upper airway making it less likely to collapse during sleep. Studies have also shown that compliance with CPAP treatment improves leptin (hunger inhibitory hormone) imbalance.  4. At risk for heart disease Due to Elizabeth Escobar's current state of health and medical condition(s), she is at a higher risk for heart disease.  This puts the patient at much greater risk to subsequently develop cardiopulmonary conditions that can significantly affect patient's quality of life in a negative manner.    At least 8 minutes were spent on counseling Elizabeth Escobar about these concerns today. Evidence-based interventions for health behavior change were utilized today including the discussion of self monitoring techniques, problem-solving barriers, and SMART goal setting techniques.  Specifically, regarding patient's less desirable eating habits and patterns, we employed the technique of small changes when Elizabeth Escobar has not been able to fully commit to her prudent nutritional plan.  5. Obesity with current BMI of 67.2  Course: Elizabeth Escobar is currently in the action stage of change. As such, her goal is to continue with weight loss efforts.   Nutrition goals: She has agreed to the Category 4 Plan.   Exercise goals:  As is.  Behavioral modification strategies: increasing lean protein intake, decreasing simple carbohydrates, increasing vegetables, and increasing water intake.  Elizabeth Escobar has agreed to follow-up with our clinic in 4 weeks. She was informed of the importance of frequent follow-up visits to maximize her success with intensive lifestyle modifications for her multiple health conditions.   Objective:   Blood pressure  123/77, pulse 97, temperature 99.2 F (37.3 C), height 5\' 5"  (1.651 m), weight (!)  404 lb (183.3 kg), SpO2 97 %. Body mass index is 67.23 kg/m.  General: Cooperative, alert, well developed, in no acute distress. HEENT: Conjunctivae and lids unremarkable. Cardiovascular: Regular rhythm.  Lungs: Normal work of breathing. Neurologic: No focal deficits.   Lab Results  Component Value Date   CREATININE 0.56 (L) 06/14/2020   BUN 14 06/14/2020   NA 138 06/14/2020   K 4.2 06/14/2020   CL 102 06/14/2020   CO2 24 06/14/2020   Lab Results  Component Value Date   ALT 19 06/14/2020   AST 12 06/14/2020   ALKPHOS 58 06/14/2020   BILITOT 0.4 06/14/2020   Lab Results  Component Value Date   HGBA1C 5.3 05/14/2019   Lab Results  Component Value Date   INSULIN 28.3 (H) 06/14/2020   INSULIN 217.0 (H) 10/29/2019   INSULIN 18.4 05/14/2019   Lab Results  Component Value Date   TSH 2.540 05/14/2019   Lab Results  Component Value Date   CHOL 161 06/14/2020   HDL 47 06/14/2020   LDLCALC 102 (H) 06/14/2020   TRIG 57 06/14/2020   CHOLHDL 3.4 06/14/2020   Lab Results  Component Value Date   VD25OH 27.4 (L) 06/14/2020   VD25OH 26.2 (L) 10/29/2019   VD25OH 30.5 05/14/2019   Lab Results  Component Value Date   WBC 7.7 06/14/2020   HGB 12.6 06/14/2020   HCT 38.4 06/14/2020   MCV 89 06/14/2020   PLT 284 06/14/2020   Lab Results  Component Value Date   IRON 36 06/14/2020   TIBC 332 06/14/2020   FERRITIN 142 06/14/2020   Attestation Statements:   Reviewed by clinician on day of visit: allergies, medications, problem list, medical history, surgical history, family history, social history, and previous encounter notes.  I, 06/16/2020, CMA, am acting as transcriptionist for Insurance claims handler, DO  I have reviewed the above documentation for accuracy and completeness, and I agree with the above. Helane Rima, DO

## 2021-02-27 ENCOUNTER — Other Ambulatory Visit (INDEPENDENT_AMBULATORY_CARE_PROVIDER_SITE_OTHER): Payer: Self-pay | Admitting: Family Medicine

## 2021-02-27 DIAGNOSIS — R7301 Impaired fasting glucose: Secondary | ICD-10-CM

## 2021-02-28 MED ORDER — OZEMPIC (0.25 OR 0.5 MG/DOSE) 2 MG/1.5ML ~~LOC~~ SOPN: 0.2500 mg | PEN_INJECTOR | SUBCUTANEOUS | 0 refills | Status: DC

## 2021-02-28 NOTE — Telephone Encounter (Signed)
Pt last seen by Dr. Wallace.  

## 2021-03-01 DIAGNOSIS — G4733 Obstructive sleep apnea (adult) (pediatric): Secondary | ICD-10-CM | POA: Diagnosis not present

## 2021-03-14 ENCOUNTER — Encounter (INDEPENDENT_AMBULATORY_CARE_PROVIDER_SITE_OTHER): Payer: Self-pay | Admitting: Family Medicine

## 2021-03-28 ENCOUNTER — Encounter (INDEPENDENT_AMBULATORY_CARE_PROVIDER_SITE_OTHER): Payer: Self-pay | Admitting: Family Medicine

## 2021-03-28 ENCOUNTER — Other Ambulatory Visit: Payer: Self-pay

## 2021-03-28 ENCOUNTER — Ambulatory Visit (INDEPENDENT_AMBULATORY_CARE_PROVIDER_SITE_OTHER): Payer: BC Managed Care – PPO | Admitting: Family Medicine

## 2021-03-28 VITALS — BP 143/82 | HR 81 | Temp 98.3°F | Ht 65.0 in | Wt >= 6400 oz

## 2021-03-28 DIAGNOSIS — G4733 Obstructive sleep apnea (adult) (pediatric): Secondary | ICD-10-CM

## 2021-03-28 DIAGNOSIS — R7301 Impaired fasting glucose: Secondary | ICD-10-CM

## 2021-03-28 DIAGNOSIS — E8881 Metabolic syndrome: Secondary | ICD-10-CM

## 2021-03-28 DIAGNOSIS — Z9189 Other specified personal risk factors, not elsewhere classified: Secondary | ICD-10-CM

## 2021-03-28 DIAGNOSIS — Z6841 Body Mass Index (BMI) 40.0 and over, adult: Secondary | ICD-10-CM

## 2021-03-28 DIAGNOSIS — Z9989 Dependence on other enabling machines and devices: Secondary | ICD-10-CM

## 2021-03-28 MED ORDER — TIRZEPATIDE 2.5 MG/0.5ML ~~LOC~~ SOAJ: 2.5000 mg | SUBCUTANEOUS | 0 refills | Status: DC

## 2021-03-29 NOTE — Progress Notes (Signed)
Chief Complaint:   OBESITY Elizabeth Escobar is here to discuss her progress with her obesity treatment plan along with follow-up of her obesity related diagnoses.   Today's visit was #: 25 Starting weight: 426 lbs Starting date: 05/14/2019 Today's weight: 403 lbs Today's date: 03/28/2021 Weight change since last visit: 1 lb Total lbs lost to date: 23 lbs Body mass index is 67.06 kg/m.  Total weight loss percentage to date: -5.40%  Current Meal Plan: the Category 4 Plan for 75% of the time.  Current Exercise Plan: Increase activity. Current Anti-Obesity Medications: Ozempic 0.25 mg subcutaneously weekly. Side effects: None.  Interim History:  Elizabeth Escobar restarted Ozempic last month.  She is tolerating the 0.25 mg dose.  Assessment/Plan:   1. Impaired fasting glucose, with polyphagia Not controlled. Stop Ozempic and start Mounjaro 2.5 mg subcutaneously weekly, as per below.  - Start tirzepatide [facility]) 2.5 MG/0.5ML Pen; Inject 2.5 mg into the skin once a week.  Dispense: 2 mL; Refill: 0  2. Metabolic syndrome Starting goal: Lose 7-10% of starting weight. She will continue to focus on protein-rich, low simple carbohydrate foods. We reviewed the importance of hydration, regular exercise for stress reduction, and restorative sleep.  We will continue to check lab work every 3 months, with 10% weight loss, or should any other concerns arise.  3. OSA on CPAP OSA is a cause of systemic hypertension and is associated with an increased incidence of stroke, heart failure, atrial fibrillation, and coronary heart disease. Severe OSA increases all-cause mortality and cardiovascular mortality.   Goal: Treatment of OSA via CPAP compliance and weight loss. Plasma ghrelin levels (appetite or "hunger hormone") are significantly higher in OSA patients than in BMI-matched controls, but decrease to levels similar to those of obese patients without OSA after CPAP treatment.  Weight loss improves OSA by  several mechanisms, including reduction in fatty tissue in the throat (i.e. parapharyngeal fat) and the tongue. Loss of abdominal fat increases mediastinal traction on the upper airway making it less likely to collapse during sleep. Studies have also shown that compliance with CPAP treatment improves leptin (hunger inhibitory hormone) imbalance.  4. At risk for heart disease Due to Elizabeth Escobar's current state of health and medical condition(s), she is at a higher risk for heart disease.  This puts the patient at much greater risk to subsequently develop cardiopulmonary conditions that can significantly affect patient's quality of life in a negative manner.    At least 8 minutes were spent on counseling Elizabeth Escobar about these concerns today. Evidence-based interventions for health behavior change were utilized today including the discussion of self monitoring techniques, problem-solving barriers, and SMART goal setting techniques.  Specifically, regarding patient's less desirable eating habits and patterns, we employed the technique of small changes when Elizabeth Escobar has not been able to fully commit to her prudent nutritional plan.  5. Obesity with current BMI of 67.2  Course: Elizabeth Escobar is currently in the action stage of change. As such, her goal is to continue with weight loss efforts.   Nutrition goals: She has agreed to the Category 4 Plan.   Exercise goals:  As is.  Behavioral modification strategies: increasing lean protein intake, decreasing simple carbohydrates, increasing vegetables, increasing water intake, decreasing liquid calories, and decreasing alcohol intake.  Elizabeth Escobar has agreed to follow-up with our clinic in 4 weeks. She was informed of the importance of frequent follow-up visits to maximize her success with intensive lifestyle modifications for her multiple health conditions.   Objective:  Blood pressure (!) 143/82, pulse 81, temperature 98.3 F (36.8 C), temperature source Oral, height  5\' 5"  (1.651 m), weight (!) 403 lb (182.8 kg), SpO2 96 %. Body mass index is 67.06 kg/m.  General: Cooperative, alert, well developed, in no acute distress. HEENT: Conjunctivae and lids unremarkable. Cardiovascular: Regular rhythm.  Lungs: Normal work of breathing. Neurologic: No focal deficits.   Lab Results  Component Value Date   CREATININE 0.56 (L) 06/14/2020   BUN 14 06/14/2020   NA 138 06/14/2020   K 4.2 06/14/2020   CL 102 06/14/2020   CO2 24 06/14/2020   Lab Results  Component Value Date   ALT 19 06/14/2020   AST 12 06/14/2020   ALKPHOS 58 06/14/2020   BILITOT 0.4 06/14/2020   Lab Results  Component Value Date   HGBA1C 5.3 05/14/2019   Lab Results  Component Value Date   INSULIN 28.3 (H) 06/14/2020   INSULIN 217.0 (H) 10/29/2019   INSULIN 18.4 05/14/2019   Lab Results  Component Value Date   TSH 2.540 05/14/2019   Lab Results  Component Value Date   CHOL 161 06/14/2020   HDL 47 06/14/2020   LDLCALC 102 (H) 06/14/2020   TRIG 57 06/14/2020   CHOLHDL 3.4 06/14/2020   Lab Results  Component Value Date   VD25OH 27.4 (L) 06/14/2020   VD25OH 26.2 (L) 10/29/2019   VD25OH 30.5 05/14/2019   Lab Results  Component Value Date   WBC 7.7 06/14/2020   HGB 12.6 06/14/2020   HCT 38.4 06/14/2020   MCV 89 06/14/2020   PLT 284 06/14/2020   Lab Results  Component Value Date   IRON 36 06/14/2020   TIBC 332 06/14/2020   FERRITIN 142 06/14/2020   Attestation Statements:   Reviewed by clinician on day of visit: allergies, medications, problem list, medical history, surgical history, family history, social history, and previous encounter notes.  I, 06/16/2020, CMA, am acting as transcriptionist for Insurance claims handler, DO  I have reviewed the above documentation for accuracy and completeness, and I agree with the above. Helane Rima, DO

## 2021-04-01 DIAGNOSIS — G4733 Obstructive sleep apnea (adult) (pediatric): Secondary | ICD-10-CM | POA: Diagnosis not present

## 2021-04-06 ENCOUNTER — Encounter (INDEPENDENT_AMBULATORY_CARE_PROVIDER_SITE_OTHER): Payer: Self-pay | Admitting: Family Medicine

## 2021-05-01 ENCOUNTER — Other Ambulatory Visit (INDEPENDENT_AMBULATORY_CARE_PROVIDER_SITE_OTHER): Payer: Self-pay | Admitting: Family Medicine

## 2021-05-01 DIAGNOSIS — R7301 Impaired fasting glucose: Secondary | ICD-10-CM

## 2021-05-01 DIAGNOSIS — G4733 Obstructive sleep apnea (adult) (pediatric): Secondary | ICD-10-CM | POA: Diagnosis not present

## 2021-05-02 ENCOUNTER — Encounter (INDEPENDENT_AMBULATORY_CARE_PROVIDER_SITE_OTHER): Payer: Self-pay | Admitting: Family Medicine

## 2021-05-02 ENCOUNTER — Other Ambulatory Visit: Payer: Self-pay

## 2021-05-02 ENCOUNTER — Ambulatory Visit (INDEPENDENT_AMBULATORY_CARE_PROVIDER_SITE_OTHER): Payer: BC Managed Care – PPO | Admitting: Family Medicine

## 2021-05-02 VITALS — BP 162/66 | HR 76 | Temp 98.1°F | Ht 65.0 in | Wt >= 6400 oz

## 2021-05-02 DIAGNOSIS — R030 Elevated blood-pressure reading, without diagnosis of hypertension: Secondary | ICD-10-CM

## 2021-05-02 DIAGNOSIS — R7301 Impaired fasting glucose: Secondary | ICD-10-CM | POA: Diagnosis not present

## 2021-05-02 DIAGNOSIS — E8881 Metabolic syndrome: Secondary | ICD-10-CM

## 2021-05-02 DIAGNOSIS — Z6841 Body Mass Index (BMI) 40.0 and over, adult: Secondary | ICD-10-CM

## 2021-05-02 DIAGNOSIS — G4733 Obstructive sleep apnea (adult) (pediatric): Secondary | ICD-10-CM

## 2021-05-02 DIAGNOSIS — Z9989 Dependence on other enabling machines and devices: Secondary | ICD-10-CM

## 2021-05-02 MED ORDER — TIRZEPATIDE 2.5 MG/0.5ML ~~LOC~~ SOAJ: 2.5000 mg | SUBCUTANEOUS | 0 refills | Status: DC

## 2021-05-02 NOTE — Telephone Encounter (Signed)
Pt last seen by Dr. Wallace.  

## 2021-05-03 ENCOUNTER — Other Ambulatory Visit (INDEPENDENT_AMBULATORY_CARE_PROVIDER_SITE_OTHER): Payer: Self-pay | Admitting: Family Medicine

## 2021-05-03 DIAGNOSIS — R7301 Impaired fasting glucose: Secondary | ICD-10-CM

## 2021-05-03 NOTE — Progress Notes (Signed)
Chief Complaint:   OBESITY Elizabeth Escobar is here to discuss her progress with her obesity treatment plan along with follow-up of her obesity related diagnoses. See Medical Weight Management Flowsheet for complete bioelectrical impedance results.  Today's visit was #: 26 Starting weight: 426 lbs Starting date: 05/14/2019 Weight change since last visit: 3 lbs Total lbs lost to date: 26 lbs Total weight loss percentage to date: -6.10%  Nutrition Plan: Category 4 Meal Plan for 75% of the time. Activity: Increased activity. Anti-obesity medications: Mounjaro 2.5 mg subcutaneously weekly. Reported side effects: None.  Interim History: Elizabeth Escobar says she is tolerating Mounjaro (3 doses).  She had some mild abdominal cramps.  Elevated blood pressure today.  No headache, dizziness, edema, chest pain.  No NSAIDs or OTC medications/supplements.  Assessment/Plan:   1. Impaired fasting glucose, with polyphagia Controlled. Current treatment: Mounjaro 2.5 mg subcutaneously weekly.    Plan:  Continue Mounjaro 2.5 mg subcutaneously weekly.  Will refill today, as per below.  She will continue to focus on protein-rich, low simple carbohydrate foods. We reviewed the importance of hydration, regular exercise for stress reduction, and restorative sleep.  - Refill tirzepatide (MOUNJARO) 2.5 MG/0.5ML Pen; Inject 2.5 mg into the skin once a week.  Dispense: 2 mL; Refill: 0  2. Metabolic syndrome Starting goal: Lose 7-10% of starting weight. She will continue to focus on protein-rich, low simple carbohydrate foods. We reviewed the importance of hydration, regular exercise for stress reduction, and restorative sleep.  We will continue to check lab work every 3 months, with 10% weight loss, or should any other concerns arise.  3. OSA on CPAP Goal: Treatment of OSA via CPAP compliance and weight loss. Plasma ghrelin levels (appetite or "hunger hormone") are significantly higher in OSA patients than in BMI-matched  controls, but decrease to levels similar to those of obese patients without OSA after CPAP treatment.  Weight loss improves OSA by several mechanisms, including reduction in fatty tissue in the throat (i.e. parapharyngeal fat) and the tongue. Loss of abdominal fat increases mediastinal traction on the upper airway making it less likely to collapse during sleep. Studies have also shown that compliance with CPAP treatment improves leptin (hunger inhibitory hormone) imbalance.  4. Elevated blood pressure reading Blood pressure is elevated today.  She is not on any medications to lower blood pressure.  BP Readings from Last 3 Encounters:  05/02/21 (!) 162/66  03/28/21 (!) 143/82  02/16/21 123/77   Lab Results  Component Value Date   CREATININE 0.56 (L) 06/14/2020   5. Class 3 severe obesity with serious comorbidity and body mass index (BMI) greater than or equal to 70 in adult, unspecified obesity type [facility])  Course: Elizabeth Escobar is currently in the action stage of change. As such, her goal is to continue with weight loss efforts.   Nutrition goals: She has agreed to the Category 4 Plan.   Exercise goals:  As is.  Behavioral modification strategies: increasing lean protein intake, decreasing simple carbohydrates, increasing vegetables, and increasing water intake.  Elizabeth Escobar has agreed to follow-up with our clinic in 4 weeks. She was informed of the importance of frequent follow-up visits to maximize her success with intensive lifestyle modifications for her multiple health conditions.   Objective:   Blood pressure (!) 162/66, pulse 76, temperature 98.1 F (36.7 C), temperature source Oral, height 5\' 5"  (1.651 m), weight (!) 400 lb (181.4 kg), SpO2 98 %. Body mass index is 66.56 kg/m.  General: Cooperative, alert, well developed, in  no acute distress. HEENT: Conjunctivae and lids unremarkable. Cardiovascular: Regular rhythm.  Lungs: Normal work of breathing. Neurologic: No focal deficits.    Lab Results  Component Value Date   CREATININE 0.56 (L) 06/14/2020   BUN 14 06/14/2020   NA 138 06/14/2020   K 4.2 06/14/2020   CL 102 06/14/2020   CO2 24 06/14/2020   Lab Results  Component Value Date   ALT 19 06/14/2020   AST 12 06/14/2020   ALKPHOS 58 06/14/2020   BILITOT 0.4 06/14/2020   Lab Results  Component Value Date   HGBA1C 5.3 05/14/2019   Lab Results  Component Value Date   INSULIN 28.3 (H) 06/14/2020   INSULIN 217.0 (H) 10/29/2019   INSULIN 18.4 05/14/2019   Lab Results  Component Value Date   TSH 2.540 05/14/2019   Lab Results  Component Value Date   CHOL 161 06/14/2020   HDL 47 06/14/2020   LDLCALC 102 (H) 06/14/2020   TRIG 57 06/14/2020   CHOLHDL 3.4 06/14/2020   Lab Results  Component Value Date   VD25OH 27.4 (L) 06/14/2020   VD25OH 26.2 (L) 10/29/2019   VD25OH 30.5 05/14/2019   Lab Results  Component Value Date   WBC 7.7 06/14/2020   HGB 12.6 06/14/2020   HCT 38.4 06/14/2020   MCV 89 06/14/2020   PLT 284 06/14/2020   Lab Results  Component Value Date   IRON 36 06/14/2020   TIBC 332 06/14/2020   FERRITIN 142 06/14/2020   Attestation Statements:   Reviewed by clinician on day of visit: allergies, medications, problem list, medical history, surgical history, family history, social history, and previous encounter notes.  I, Insurance claims handler, CMA, am acting as transcriptionist for Helane Rima, DO  I have reviewed the above documentation for accuracy and completeness, and I agree with the above. -  Helane Rima, DO, MS, FAAFP, DABOM - Family and Bariatric Medicine.

## 2021-06-01 DIAGNOSIS — G4733 Obstructive sleep apnea (adult) (pediatric): Secondary | ICD-10-CM | POA: Diagnosis not present

## 2021-06-06 ENCOUNTER — Encounter (INDEPENDENT_AMBULATORY_CARE_PROVIDER_SITE_OTHER): Payer: Self-pay | Admitting: Family Medicine

## 2021-06-06 ENCOUNTER — Ambulatory Visit (INDEPENDENT_AMBULATORY_CARE_PROVIDER_SITE_OTHER): Payer: BC Managed Care – PPO | Admitting: Family Medicine

## 2021-06-06 ENCOUNTER — Telehealth: Payer: Self-pay | Admitting: Adult Health

## 2021-06-06 ENCOUNTER — Other Ambulatory Visit: Payer: Self-pay

## 2021-06-06 VITALS — BP 161/76 | HR 68 | Temp 98.0°F | Ht 65.0 in | Wt 396.0 lb

## 2021-06-06 DIAGNOSIS — R7301 Impaired fasting glucose: Secondary | ICD-10-CM | POA: Diagnosis not present

## 2021-06-06 DIAGNOSIS — Z6841 Body Mass Index (BMI) 40.0 and over, adult: Secondary | ICD-10-CM

## 2021-06-06 DIAGNOSIS — R030 Elevated blood-pressure reading, without diagnosis of hypertension: Secondary | ICD-10-CM | POA: Diagnosis not present

## 2021-06-06 DIAGNOSIS — Z9989 Dependence on other enabling machines and devices: Secondary | ICD-10-CM

## 2021-06-06 DIAGNOSIS — G4733 Obstructive sleep apnea (adult) (pediatric): Secondary | ICD-10-CM | POA: Diagnosis not present

## 2021-06-06 NOTE — Telephone Encounter (Signed)
LVM for patient to call back to reschedule because Elizabeth Escobar will not be here on 12/14.

## 2021-06-07 MED ORDER — TIRZEPATIDE 5 MG/0.5ML ~~LOC~~ SOAJ: 5.0000 mg | SUBCUTANEOUS | 0 refills | Status: DC

## 2021-06-07 NOTE — Progress Notes (Signed)
Chief Complaint:   OBESITY Elizabeth Escobar is here to discuss her progress with her obesity treatment plan along with follow-up of her obesity related diagnoses. See Medical Weight Management Flowsheet for complete bioelectrical impedance results.  Today's visit was #: 27 Starting weight: 426 lbs Starting date: 05/14/2019 Weight change since last visit: 4 lbs Total lbs lost to date: 30 lbs Total weight loss percentage to date: -7.04%  Nutrition Plan: Category 4 Plan for 75% of the time.  Activity: None. Anti-obesity medications: Mounjaro 2.5 mg subcutaneously weekly. Reported side effects: None.  Interim History: Elizabeth Escobar is tolerating Mounjaro with no side effects.  She reports having increased hunger cues.  Assessment/Plan:   1. Impaired fasting glucose, with polyphagia Not at goal. Current treatment: Mounjaro 2.5 mg subcutaneously weekly.  She is having increased hunger cues.    Plan:  Increase Mounjaro to 5 mg subcutaneously weekly. She will continue to focus on protein-rich, low simple carbohydrate foods. We reviewed the importance of hydration, regular exercise for stress reduction, and restorative sleep.  - Increase tirzepatide (MOUNJARO) 5 MG/0.5ML Pen; Inject 5 mg into the skin once a week.  Dispense: 6 mL; Refill: 0  2. Elevated blood pressure reading No diagnosis of hypertension. She understands that her blood pressures are generally elevated in our office, but she does not think that it is elevated otherwise.   Plan:  Check blood pressure at home and increase water intake.  BP Readings from Last 3 Encounters:  06/06/21 (!) 161/76  05/02/21 (!) 162/66  03/28/21 (!) 143/82   Lab Results  Component Value Date   CREATININE 0.56 (L) 06/14/2020   3. OSA on CPAP OSA is a cause of systemic hypertension and is associated with an increased incidence of stroke, heart failure, atrial fibrillation, and coronary heart disease. Severe OSA increases all-cause mortality and  cardiovascular mortality.   Goal: Treatment of OSA via CPAP compliance and weight loss. Plasma ghrelin levels (appetite or "hunger hormone") are significantly higher in OSA patients than in BMI-matched controls, but decrease to levels similar to those of obese patients without OSA after CPAP treatment.  Weight loss improves OSA by several mechanisms, including reduction in fatty tissue in the throat (i.e. parapharyngeal fat) and the tongue. Loss of abdominal fat increases mediastinal traction on the upper airway making it less likely to collapse during sleep. Studies have also shown that compliance with CPAP treatment improves leptin (hunger inhibitory hormone) imbalance.  4. Obesity with current BMI 66.0  Course: Elizabeth Escobar is currently in the action stage of change. As such, her goal is to continue with weight loss efforts.   Nutrition goals: She has agreed to the Category 4 Plan.   Exercise goals: All adults should avoid inactivity. Some physical activity is better than none, and adults who participate in any amount of physical activity gain some health benefits.  Behavioral modification strategies: increasing lean protein intake, decreasing simple carbohydrates, increasing vegetables, and increasing water intake.  Elizabeth Escobar has agreed to follow-up with our clinic in 4 weeks. She was informed of the importance of frequent follow-up visits to maximize her success with intensive lifestyle modifications for her multiple health conditions.   Objective:   Blood pressure (!) 161/76, pulse 68, temperature 98 F (36.7 C), temperature source Oral, height 5\' 5"  (1.651 m), weight (!) 396 lb (179.6 kg), SpO2 95 %. Body mass index is 65.9 kg/m.  General: Cooperative, alert, well developed, in no acute distress. HEENT: Conjunctivae and lids unremarkable. Cardiovascular: Regular rhythm.  Lungs: Normal work of breathing. Neurologic: No focal deficits.   Lab Results  Component Value Date   CREATININE  0.56 (L) 06/14/2020   BUN 14 06/14/2020   NA 138 06/14/2020   K 4.2 06/14/2020   CL 102 06/14/2020   CO2 24 06/14/2020   Lab Results  Component Value Date   ALT 19 06/14/2020   AST 12 06/14/2020   ALKPHOS 58 06/14/2020   BILITOT 0.4 06/14/2020   Lab Results  Component Value Date   HGBA1C 5.3 05/14/2019   Lab Results  Component Value Date   INSULIN 28.3 (H) 06/14/2020   INSULIN 217.0 (H) 10/29/2019   INSULIN 18.4 05/14/2019   Lab Results  Component Value Date   TSH 2.540 05/14/2019   Lab Results  Component Value Date   CHOL 161 06/14/2020   HDL 47 06/14/2020   LDLCALC 102 (H) 06/14/2020   TRIG 57 06/14/2020   CHOLHDL 3.4 06/14/2020   Lab Results  Component Value Date   VD25OH 27.4 (L) 06/14/2020   VD25OH 26.2 (L) 10/29/2019   VD25OH 30.5 05/14/2019   Lab Results  Component Value Date   WBC 7.7 06/14/2020   HGB 12.6 06/14/2020   HCT 38.4 06/14/2020   MCV 89 06/14/2020   PLT 284 06/14/2020   Lab Results  Component Value Date   IRON 36 06/14/2020   TIBC 332 06/14/2020   FERRITIN 142 06/14/2020   Attestation Statements:   Reviewed by clinician on day of visit: allergies, medications, problem list, medical history, surgical history, family history, social history, and previous encounter notes.  I, Insurance claims handler, CMA, am acting as transcriptionist for Helane Rima, DO  I have reviewed the above documentation for accuracy and completeness, and I agree with the above. -  Helane Rima, DO, MS, FAAFP, DABOM - Family and Bariatric Medicine.

## 2021-06-15 ENCOUNTER — Ambulatory Visit: Payer: BC Managed Care – PPO | Admitting: Adult Health

## 2021-06-27 ENCOUNTER — Other Ambulatory Visit (INDEPENDENT_AMBULATORY_CARE_PROVIDER_SITE_OTHER): Payer: Self-pay | Admitting: Family Medicine

## 2021-06-27 DIAGNOSIS — R7301 Impaired fasting glucose: Secondary | ICD-10-CM

## 2021-06-28 MED ORDER — TIRZEPATIDE 5 MG/0.5ML ~~LOC~~ SOAJ: 5.0000 mg | SUBCUTANEOUS | 0 refills | Status: DC

## 2021-07-20 ENCOUNTER — Other Ambulatory Visit: Payer: Self-pay

## 2021-07-20 ENCOUNTER — Ambulatory Visit (INDEPENDENT_AMBULATORY_CARE_PROVIDER_SITE_OTHER): Payer: BC Managed Care – PPO | Admitting: Family Medicine

## 2021-07-20 ENCOUNTER — Encounter (INDEPENDENT_AMBULATORY_CARE_PROVIDER_SITE_OTHER): Payer: Self-pay | Admitting: Family Medicine

## 2021-07-20 VITALS — BP 131/79 | HR 80 | Temp 97.9°F | Ht 65.0 in | Wt 387.0 lb

## 2021-07-20 DIAGNOSIS — E7800 Pure hypercholesterolemia, unspecified: Secondary | ICD-10-CM | POA: Diagnosis not present

## 2021-07-20 DIAGNOSIS — M79652 Pain in left thigh: Secondary | ICD-10-CM

## 2021-07-20 DIAGNOSIS — E559 Vitamin D deficiency, unspecified: Secondary | ICD-10-CM

## 2021-07-20 DIAGNOSIS — Z6841 Body Mass Index (BMI) 40.0 and over, adult: Secondary | ICD-10-CM

## 2021-07-20 DIAGNOSIS — E66813 Obesity, class 3: Secondary | ICD-10-CM

## 2021-07-20 DIAGNOSIS — E669 Obesity, unspecified: Secondary | ICD-10-CM

## 2021-07-20 DIAGNOSIS — R7301 Impaired fasting glucose: Secondary | ICD-10-CM | POA: Diagnosis not present

## 2021-07-20 DIAGNOSIS — E65 Localized adiposity: Secondary | ICD-10-CM

## 2021-07-21 MED ORDER — TIRZEPATIDE 5 MG/0.5ML ~~LOC~~ SOAJ: 5.0000 mg | SUBCUTANEOUS | 0 refills | Status: DC

## 2021-07-21 MED ORDER — TIRZEPATIDE 7.5 MG/0.5ML ~~LOC~~ SOAJ: 7.5000 mg | SUBCUTANEOUS | 2 refills | Status: DC

## 2021-07-22 LAB — COMPREHENSIVE METABOLIC PANEL
ALT: 10 IU/L (ref 0–32)
AST: 12 IU/L (ref 0–40)
Albumin/Globulin Ratio: 1.8 (ref 1.2–2.2)
Albumin: 4.4 g/dL (ref 3.8–4.8)
Alkaline Phosphatase: 58 IU/L (ref 44–121)
BUN/Creatinine Ratio: 20 (ref 9–23)
BUN: 13 mg/dL (ref 6–24)
Bilirubin Total: 0.5 mg/dL (ref 0.0–1.2)
CO2: 21 mmol/L (ref 20–29)
Calcium: 9.4 mg/dL (ref 8.7–10.2)
Chloride: 105 mmol/L (ref 96–106)
Creatinine, Ser: 0.65 mg/dL (ref 0.57–1.00)
Globulin, Total: 2.4 g/dL (ref 1.5–4.5)
Glucose: 96 mg/dL (ref 70–99)
Potassium: 4 mmol/L (ref 3.5–5.2)
Sodium: 144 mmol/L (ref 134–144)
Total Protein: 6.8 g/dL (ref 6.0–8.5)
eGFR: 113 mL/min/{1.73_m2} (ref >59)

## 2021-07-22 LAB — LIPID PANEL
Chol/HDL Ratio: 3.8 ratio (ref 0.0–4.4)
Cholesterol, Total: 167 mg/dL (ref 100–199)
HDL: 44 mg/dL (ref >39)
LDL Chol Calc (NIH): 113 mg/dL — ABNORMAL HIGH (ref 0–99)
Triglycerides: 49 mg/dL (ref 0–149)
VLDL Cholesterol Cal: 10 mg/dL (ref 5–40)

## 2021-07-22 LAB — CBC WITH DIFFERENTIAL/PLATELET
Basophils Absolute: 0 10*3/uL (ref 0.0–0.2)
Basos: 0 %
EOS (ABSOLUTE): 0.1 10*3/uL (ref 0.0–0.4)
Eos: 2 %
Hematocrit: 36.5 % (ref 34.0–46.6)
Hemoglobin: 11.9 g/dL (ref 11.1–15.9)
Immature Grans (Abs): 0 10*3/uL (ref 0.0–0.1)
Immature Granulocytes: 0 %
Lymphocytes Absolute: 3.2 10*3/uL — ABNORMAL HIGH (ref 0.7–3.1)
Lymphs: 48 %
MCH: 28.3 pg (ref 26.6–33.0)
MCHC: 32.6 g/dL (ref 31.5–35.7)
MCV: 87 fL (ref 79–97)
Monocytes Absolute: 0.4 10*3/uL (ref 0.1–0.9)
Monocytes: 6 %
Neutrophils Absolute: 2.9 10*3/uL (ref 1.4–7.0)
Neutrophils: 44 %
Platelets: 285 10*3/uL (ref 150–450)
RBC: 4.21 x10E6/uL (ref 3.77–5.28)
RDW: 12.4 % (ref 11.7–15.4)
WBC: 6.7 10*3/uL (ref 3.4–10.8)

## 2021-07-22 LAB — HEMOGLOBIN A1C
Est. average glucose Bld gHb Est-mCnc: 94 mg/dL
Hgb A1c MFr Bld: 4.9 % (ref 4.8–5.6)

## 2021-07-22 LAB — INSULIN, RANDOM: INSULIN: 134 u[IU]/mL — ABNORMAL HIGH (ref 2.6–24.9)

## 2021-07-22 LAB — VITAMIN D 25 HYDROXY (VIT D DEFICIENCY, FRACTURES): Vit D, 25-Hydroxy: 20.9 ng/mL — ABNORMAL LOW (ref 30.0–100.0)

## 2021-07-25 ENCOUNTER — Other Ambulatory Visit (INDEPENDENT_AMBULATORY_CARE_PROVIDER_SITE_OTHER): Payer: Self-pay | Admitting: Family Medicine

## 2021-07-25 DIAGNOSIS — R7301 Impaired fasting glucose: Secondary | ICD-10-CM

## 2021-07-25 NOTE — Progress Notes (Signed)
Chief Complaint:   OBESITY Elizabeth Escobar is here to discuss her progress with her obesity treatment plan along with follow-up of her obesity related diagnoses. See Medical Weight Management Flowsheet for complete bioelectrical impedance results.  Today's visit was #: 28 Starting weight: 426 lbs Starting date: 05/14/2019 Weight change since last visit: 9 lbs Total lbs lost to date: 39 lbs Total weight loss percentage to date: -9.15%  Nutrition Plan: Category 4 Plan for 75% of the time.  Activity: Increased activity. Anti-obesity medications: Mounjaro 5 mg subcutaneously weekly. Reported side effects: None.  Interim History: Elizabeth Escobar says she is having less right knee pain with weight loss, but states that her left thigh pain is still an issue.  She reports that she is tolerating her medications.  Assessment/Plan:   1. Impaired fasting glucose, with polyphagia Controlled. Current treatment: Mounjaro 5 mg subcutaneously weekly.    Plan:  Continue Mounjaro 5 mg weekly for 1 month, then increase Mounjaro to 7.5 mg weekly. She will continue to focus on protein-rich, low simple carbohydrate foods. We reviewed the importance of hydration, regular exercise for stress reduction, and restorative sleep.  - CBC with Differential/Platelet - Hemoglobin A1c - Insulin, random - Refill tirzepatide (MOUNJARO) 5 MG/0.5ML Pen; Inject 5 mg into the skin once a week.  Dispense: 2 mL; Refill: 0 - Increase after 1 month tirzepatide (MOUNJARO) 7.5 MG/0.5ML Pen; Inject 7.5 mg into the skin once a week.  Dispense: 2 mL; Refill: 2  2. Left thigh pain Ongoing.  Will continue to monitor as it relates to her weight loss journey.  3. Visceral obesity Current visceral fat rating: 30. Visceral fat rating goal is < 13. Visceral adipose tissue is a hormonally active component of total body fat. This body composition phenotype is associated with medical disorders such as metabolic syndrome, cardiovascular disease, and  several malignancies including prostate, breast, and colorectal cancers. Starting goal: Lose 7-10% of starting weight.   4. Vitamin D deficiency Not at goal. She is taking a daily multivitamin.  Plan: Will check vitamin D level today.  Lab Results  Component Value Date   VD25OH 20.9 (L) 07/20/2021   VD25OH 27.4 (L) 06/14/2020   VD25OH 26.2 (L) 10/29/2019   - VITAMIN D 25 Hydroxy (Vit-D Deficiency, Fractures)  5. Pure hypercholesterolemia Course: Not optimized. Lipid-lowering medications: None.   Plan: Dietary changes: Increase soluble fiber, decrease simple carbohydrates, decrease saturated fat. Exercise changes: Moderate to vigorous-intensity aerobic activity 150 minutes per week or as tolerated. We will continue to monitor along with PCP/specialists as it pertains to her weight loss journey.  Lab Results  Component Value Date   CHOL 167 07/20/2021   HDL 44 07/20/2021   LDLCALC 113 (H) 07/20/2021   TRIG 49 07/20/2021   CHOLHDL 3.8 07/20/2021   Lab Results  Component Value Date   ALT 10 07/20/2021   AST 12 07/20/2021   ALKPHOS 58 07/20/2021   BILITOT 0.5 07/20/2021   The 10-year ASCVD risk score (Arnett DK, et al., 2019) is: 1.1%   Values used to calculate the score:     Age: 41 years     Sex: Female     Is Non-Hispanic African American: Yes     Diabetic: No     Tobacco smoker: No     Systolic Blood Pressure: [id number] mmHg     Is BP treated: No     HDL Cholesterol: 44 mg/dL     Total Cholesterol: 167 mg/dL  - Comprehensive metabolic  panel - Lipid panel  6. Obesity with current BMI 64.5  Course: Elizabeth Escobar is currently in the action stage of change. As such, her goal is to continue with weight loss efforts.   Nutrition goals: She has agreed to practicing portion control and making smarter food choices, such as increasing vegetables and decreasing simple carbohydrates.   Exercise goals:  As is.  Behavioral modification strategies: increasing lean protein intake,  decreasing simple carbohydrates, and increasing vegetables.  Elizabeth Escobar has agreed to follow-up with our clinic in 4 weeks. She was informed of the importance of frequent follow-up visits to maximize her success with intensive lifestyle modifications for her multiple health conditions.   Elizabeth Escobar was informed we would discuss her lab results at her next visit unless there is a critical issue that needs to be addressed sooner. Elizabeth Escobar agreed to keep her next visit at the agreed upon time to discuss these results.  Objective:   Blood pressure 131/79, pulse 80, temperature 97.9 F (36.6 C), temperature source Oral, height 5\' 5"  (1.651 m), weight (!) 387 lb (175.5 kg), SpO2 98 %. Body mass index is 64.4 kg/m.  General: Cooperative, alert, well developed, in no acute distress. HEENT: Conjunctivae and lids unremarkable. Cardiovascular: Regular rhythm.  Lungs: Normal work of breathing. Neurologic: No focal deficits.   Lab Results  Component Value Date   CREATININE 0.65 07/20/2021   BUN 13 07/20/2021   NA 144 07/20/2021   K 4.0 07/20/2021   CL 105 07/20/2021   CO2 21 07/20/2021   Lab Results  Component Value Date   ALT 10 07/20/2021   AST 12 07/20/2021   ALKPHOS 58 07/20/2021   BILITOT 0.5 07/20/2021   Lab Results  Component Value Date   HGBA1C 4.9 07/20/2021   HGBA1C 5.3 05/14/2019   Lab Results  Component Value Date   INSULIN 134.0 (H) 07/20/2021   INSULIN 28.3 (H) 06/14/2020   INSULIN 217.0 (H) 10/29/2019   INSULIN 18.4 05/14/2019   Lab Results  Component Value Date   TSH 2.540 05/14/2019   Lab Results  Component Value Date   CHOL 167 07/20/2021   HDL 44 07/20/2021   LDLCALC 113 (H) 07/20/2021   TRIG 49 07/20/2021   CHOLHDL 3.8 07/20/2021   Lab Results  Component Value Date   VD25OH 20.9 (L) 07/20/2021   VD25OH 27.4 (L) 06/14/2020   VD25OH 26.2 (L) 10/29/2019   Lab Results  Component Value Date   WBC 6.7 07/20/2021   HGB 11.9 07/20/2021   HCT 36.5  07/20/2021   MCV 87 07/20/2021   PLT 285 07/20/2021   Lab Results  Component Value Date   IRON 36 06/14/2020   TIBC 332 06/14/2020   FERRITIN 142 06/14/2020   Attestation Statements:   Reviewed by clinician on day of visit: allergies, medications, problem list, medical history, surgical history, family history, social history, and previous encounter notes.  I, Water quality scientist, CMA, am acting as transcriptionist for Briscoe Deutscher, DO  I have reviewed the above documentation for accuracy and completeness, and I agree with the above. -  Briscoe Deutscher, DO, MS, FAAFP, DABOM - Family and Bariatric Medicine.

## 2021-07-25 NOTE — Telephone Encounter (Signed)
Dr.Wallace °

## 2021-08-05 ENCOUNTER — Encounter (INDEPENDENT_AMBULATORY_CARE_PROVIDER_SITE_OTHER): Payer: Self-pay | Admitting: Family Medicine

## 2021-08-05 DIAGNOSIS — R7301 Impaired fasting glucose: Secondary | ICD-10-CM

## 2021-08-08 MED ORDER — TIRZEPATIDE 5 MG/0.5ML ~~LOC~~ SOAJ: 5.0000 mg | SUBCUTANEOUS | 0 refills | Status: DC

## 2021-08-08 MED ORDER — TIRZEPATIDE 7.5 MG/0.5ML ~~LOC~~ SOAJ: 7.5000 mg | SUBCUTANEOUS | 2 refills | Status: DC

## 2021-08-08 NOTE — Telephone Encounter (Signed)
Please review

## 2021-08-10 DIAGNOSIS — G4733 Obstructive sleep apnea (adult) (pediatric): Secondary | ICD-10-CM | POA: Diagnosis not present

## 2021-08-16 ENCOUNTER — Encounter (INDEPENDENT_AMBULATORY_CARE_PROVIDER_SITE_OTHER): Payer: Self-pay

## 2021-08-28 ENCOUNTER — Other Ambulatory Visit (INDEPENDENT_AMBULATORY_CARE_PROVIDER_SITE_OTHER): Payer: Self-pay | Admitting: Family Medicine

## 2021-08-28 DIAGNOSIS — R7301 Impaired fasting glucose: Secondary | ICD-10-CM

## 2021-08-29 NOTE — Telephone Encounter (Signed)
LOV w/ Wallace

## 2021-08-30 MED ORDER — TIRZEPATIDE 5 MG/0.5ML ~~LOC~~ SOAJ: 5.0000 mg | SUBCUTANEOUS | 0 refills | Status: DC

## 2021-09-07 ENCOUNTER — Ambulatory Visit (INDEPENDENT_AMBULATORY_CARE_PROVIDER_SITE_OTHER): Payer: BC Managed Care – PPO | Admitting: Family Medicine

## 2021-09-07 DIAGNOSIS — G4733 Obstructive sleep apnea (adult) (pediatric): Secondary | ICD-10-CM | POA: Diagnosis not present

## 2021-09-19 ENCOUNTER — Ambulatory Visit (INDEPENDENT_AMBULATORY_CARE_PROVIDER_SITE_OTHER): Payer: BC Managed Care – PPO | Admitting: Family Medicine

## 2021-09-19 ENCOUNTER — Encounter (INDEPENDENT_AMBULATORY_CARE_PROVIDER_SITE_OTHER): Payer: Self-pay | Admitting: Family Medicine

## 2021-09-19 ENCOUNTER — Other Ambulatory Visit: Payer: Self-pay

## 2021-09-19 VITALS — BP 150/75 | HR 78 | Temp 98.0°F | Ht 65.0 in | Wt 386.0 lb

## 2021-09-19 DIAGNOSIS — E669 Obesity, unspecified: Secondary | ICD-10-CM

## 2021-09-19 DIAGNOSIS — E65 Localized adiposity: Secondary | ICD-10-CM

## 2021-09-19 DIAGNOSIS — G4733 Obstructive sleep apnea (adult) (pediatric): Secondary | ICD-10-CM | POA: Diagnosis not present

## 2021-09-19 DIAGNOSIS — I10 Essential (primary) hypertension: Secondary | ICD-10-CM | POA: Diagnosis not present

## 2021-09-19 DIAGNOSIS — R7301 Impaired fasting glucose: Secondary | ICD-10-CM | POA: Diagnosis not present

## 2021-09-19 DIAGNOSIS — Z6841 Body Mass Index (BMI) 40.0 and over, adult: Secondary | ICD-10-CM

## 2021-09-19 DIAGNOSIS — Z9989 Dependence on other enabling machines and devices: Secondary | ICD-10-CM

## 2021-09-20 MED ORDER — SPIRONOLACTONE 50 MG PO TABS: 50.0000 mg | ORAL_TABLET | Freq: Every day | ORAL | 4 refills | Status: AC

## 2021-09-20 MED ORDER — TIRZEPATIDE 5 MG/0.5ML ~~LOC~~ SOAJ: 5.0000 mg | SUBCUTANEOUS | 3 refills | Status: AC

## 2021-09-26 ENCOUNTER — Encounter (INDEPENDENT_AMBULATORY_CARE_PROVIDER_SITE_OTHER): Payer: Self-pay | Admitting: Family Medicine

## 2021-09-28 ENCOUNTER — Encounter (INDEPENDENT_AMBULATORY_CARE_PROVIDER_SITE_OTHER): Payer: Self-pay

## 2021-09-28 NOTE — Progress Notes (Signed)
Chief Complaint:   OBESITY Elizabeth Escobar is here to discuss her progress with her obesity treatment plan along with follow-up of her obesity related diagnoses. See Medical Weight Management Flowsheet for complete bioelectrical impedance results.  Today's visit was #: 29 Starting weight: 426 lbs Starting date: 05/14/2019 Weight change since last visit: 1 lb Total lbs lost to date: 40 lbs Total weight loss percentage to date: -9.39%  Nutrition Plan: Category 4 Plan for 75% of the time.  Activity: None. Anti-obesity medications: Mounjaro 5 mg subcutaneously weekly. Reported side effects: None.  Interim History: Elizabeth Escobar is on Mounjaro 5 mg.  She says she is eating 1 meal per day.  If she overeats, she has nausea/vomiting.  She says she is currently working 3 jobs.  She reports knee pain, but says it is manageable.  She says she is doing better with water.  Assessment/Plan:   1. Essential hypertension, edema Elevated today. Medications: None.   Plan: Start spironolactone 50 mg daily. Avoid buying foods that are: processed, frozen, or prepackaged to avoid excess salt. We will watch for signs of hypotension as she continues lifestyle modifications.  BP Readings from Last 3 Encounters:  09/19/21 (!) 150/75  07/20/21 131/79  06/06/21 (!) 161/76   Lab Results  Component Value Date   CREATININE 0.65 07/20/2021   - Start spironolactone (ALDACTONE) 50 MG tablet; Take 1 tablet (50 mg total) by mouth daily.  Dispense: 30 tablet; Refill: 4  2. Impaired fasting glucose, with polyphagia Improving. Current treatment: Mounjaro 5 mg subcutaneously weekly.    Plan: Continue Mounjaro 5 mg subcutaneously weekly.  Will refill today, as per below.  She will continue to focus on protein-rich, low simple carbohydrate foods. We reviewed the importance of hydration, regular exercise for stress reduction, and restorative sleep.  - Refill tirzepatide (MOUNJARO) 5 MG/0.5ML Pen; Inject 5 mg into the skin  once a week.  Dispense: 2 mL; Refill: 3  3. OSA on CPAP OSA is a cause of systemic hypertension and is associated with an increased incidence of stroke, heart failure, atrial fibrillation, and coronary heart disease. Severe OSA increases all-cause mortality and cardiovascular mortality.   Goal: Treatment of OSA via CPAP compliance and weight loss. Plasma ghrelin levels (appetite or "hunger hormone") are significantly higher in OSA patients than in BMI-matched controls, but decrease to levels similar to those of obese patients without OSA after CPAP treatment.  Weight loss improves OSA by several mechanisms, including reduction in fatty tissue in the throat (i.e. parapharyngeal fat) and the tongue. Loss of abdominal fat increases mediastinal traction on the upper airway making it less likely to collapse during sleep. Studies have also shown that compliance with CPAP treatment improves leptin (hunger inhibitory hormone) imbalance.  4. Visceral obesity Current visceral fat rating: 31. Visceral fat rating goal is < 13. Visceral adipose tissue is a hormonally active component of total body fat. This body composition phenotype is associated with medical disorders such as metabolic syndrome, cardiovascular disease, and several malignancies including prostate, breast, and colorectal cancers. Starting goal: Lose 7-10% of starting weight.   5. Obesity with current BMI 64.3  Course: Elizabeth Escobar is currently in the action stage of change. As such, her goal is to continue with weight loss efforts.   Nutrition goals: She has agreed to the Category 4 Plan.   Exercise goals:  As is.  Behavioral modification strategies: increasing lean protein intake, decreasing simple carbohydrates, and increasing vegetables.  Elizabeth Escobar has agreed to follow-up with our  clinic in 4 weeks. She was informed of the importance of frequent follow-up visits to maximize her success with intensive lifestyle modifications for her multiple  health conditions.   Objective:   Blood pressure (!) 150/75, pulse 78, temperature 98 F (36.7 C), temperature source Oral, height 5\' 5"  (1.651 m), weight (!) 386 lb (175.1 kg), SpO2 100 %. Body mass index is 64.23 kg/m.  General: Cooperative, alert, well developed, in no acute distress. HEENT: Conjunctivae and lids unremarkable. Cardiovascular: Regular rhythm.  Lungs: Normal work of breathing. Neurologic: No focal deficits.   Lab Results  Component Value Date   CREATININE 0.65 07/20/2021   BUN 13 07/20/2021   NA 144 07/20/2021   K 4.0 07/20/2021   CL 105 07/20/2021   CO2 21 07/20/2021   Lab Results  Component Value Date   ALT 10 07/20/2021   AST 12 07/20/2021   ALKPHOS 58 07/20/2021   BILITOT 0.5 07/20/2021   Lab Results  Component Value Date   HGBA1C 4.9 07/20/2021   HGBA1C 5.3 05/14/2019   Lab Results  Component Value Date   INSULIN 134.0 (H) 07/20/2021   INSULIN 28.3 (H) 06/14/2020   INSULIN 217.0 (H) 10/29/2019   INSULIN 18.4 05/14/2019   Lab Results  Component Value Date   TSH 2.540 05/14/2019   Lab Results  Component Value Date   CHOL 167 07/20/2021   HDL 44 07/20/2021   LDLCALC 113 (H) 07/20/2021   TRIG 49 07/20/2021   CHOLHDL 3.8 07/20/2021   Lab Results  Component Value Date   VD25OH 20.9 (L) 07/20/2021   VD25OH 27.4 (L) 06/14/2020   VD25OH 26.2 (L) 10/29/2019   Lab Results  Component Value Date   WBC 6.7 07/20/2021   HGB 11.9 07/20/2021   HCT 36.5 07/20/2021   MCV 87 07/20/2021   PLT 285 07/20/2021   Lab Results  Component Value Date   IRON 36 06/14/2020   TIBC 332 06/14/2020   FERRITIN 142 06/14/2020   Attestation Statements:   Reviewed by clinician on day of visit: allergies, medications, problem list, medical history, surgical history, family history, social history, and previous encounter notes.  Time spent on visit including pre-visit chart review and post-visit care and documentation was 42 minutes. Time was spent on:  Food choices and timing of food intake reviewed today. I discussed a personalized meal plan with the patient that will help her to lose weight and will improve her obesity-related conditions going forward. I performed a medically necessary appropriate examination and/or evaluation. I discussed the assessment and treatment plan with the patient. Motivational interviewing as well as evidence-based interventions for health behavior change were utilized today including the discussion of self monitoring techniques, problem-solving barriers and SMART goal setting techniques.  An exercise prescription was reviewed.  The patient was provided an opportunity to ask questions and all were answered. The patient agreed with the plan and demonstrated an understanding of the instructions. Clinical information was updated and documented in the EMR.  I, 06/16/2020, CMA, am acting as transcriptionist for Insurance claims handler, DO  I have reviewed the above documentation for accuracy and completeness, and I agree with the above. -  Helane Rima, DO, MS, FAAFP, DABOM - Family and Bariatric Medicine.

## 2021-10-08 DIAGNOSIS — G4733 Obstructive sleep apnea (adult) (pediatric): Secondary | ICD-10-CM | POA: Diagnosis not present

## 2021-10-17 DIAGNOSIS — Z6841 Body Mass Index (BMI) 40.0 and over, adult: Secondary | ICD-10-CM | POA: Diagnosis not present

## 2021-10-17 DIAGNOSIS — Z124 Encounter for screening for malignant neoplasm of cervix: Secondary | ICD-10-CM | POA: Diagnosis not present

## 2021-10-17 DIAGNOSIS — Z1151 Encounter for screening for human papillomavirus (HPV): Secondary | ICD-10-CM | POA: Diagnosis not present

## 2021-10-17 DIAGNOSIS — Z01419 Encounter for gynecological examination (general) (routine) without abnormal findings: Secondary | ICD-10-CM | POA: Diagnosis not present

## 2021-11-09 DIAGNOSIS — G4733 Obstructive sleep apnea (adult) (pediatric): Secondary | ICD-10-CM | POA: Diagnosis not present

## 2021-12-10 DIAGNOSIS — G4733 Obstructive sleep apnea (adult) (pediatric): Secondary | ICD-10-CM | POA: Diagnosis not present

## 2022-01-02 DIAGNOSIS — R7301 Impaired fasting glucose: Secondary | ICD-10-CM | POA: Diagnosis not present

## 2022-01-02 DIAGNOSIS — G4733 Obstructive sleep apnea (adult) (pediatric): Secondary | ICD-10-CM | POA: Diagnosis not present

## 2022-01-02 DIAGNOSIS — E559 Vitamin D deficiency, unspecified: Secondary | ICD-10-CM | POA: Diagnosis not present

## 2022-01-02 DIAGNOSIS — I10 Essential (primary) hypertension: Secondary | ICD-10-CM | POA: Diagnosis not present

## 2022-01-09 DIAGNOSIS — G4733 Obstructive sleep apnea (adult) (pediatric): Secondary | ICD-10-CM | POA: Diagnosis not present

## 2022-01-11 DIAGNOSIS — Z1231 Encounter for screening mammogram for malignant neoplasm of breast: Secondary | ICD-10-CM | POA: Diagnosis not present

## 2022-01-18 DIAGNOSIS — R87810 Cervical high risk human papillomavirus (HPV) DNA test positive: Secondary | ICD-10-CM | POA: Diagnosis not present

## 2022-01-18 DIAGNOSIS — Z0000 Encounter for general adult medical examination without abnormal findings: Secondary | ICD-10-CM | POA: Diagnosis not present

## 2022-01-18 DIAGNOSIS — G4719 Other hypersomnia: Secondary | ICD-10-CM | POA: Diagnosis not present

## 2022-01-18 DIAGNOSIS — Z6841 Body Mass Index (BMI) 40.0 and over, adult: Secondary | ICD-10-CM | POA: Diagnosis not present

## 2022-01-18 DIAGNOSIS — R87610 Atypical squamous cells of undetermined significance on cytologic smear of cervix (ASC-US): Secondary | ICD-10-CM | POA: Diagnosis not present

## 2022-01-18 DIAGNOSIS — M654 Radial styloid tenosynovitis [de Quervain]: Secondary | ICD-10-CM | POA: Diagnosis not present

## 2022-01-18 DIAGNOSIS — E559 Vitamin D deficiency, unspecified: Secondary | ICD-10-CM | POA: Diagnosis not present

## 2022-02-08 ENCOUNTER — Encounter (INDEPENDENT_AMBULATORY_CARE_PROVIDER_SITE_OTHER): Payer: Self-pay

## 2022-02-10 DIAGNOSIS — G4733 Obstructive sleep apnea (adult) (pediatric): Secondary | ICD-10-CM | POA: Diagnosis not present

## 2022-02-22 DIAGNOSIS — G4733 Obstructive sleep apnea (adult) (pediatric): Secondary | ICD-10-CM | POA: Diagnosis not present

## 2022-02-22 DIAGNOSIS — E559 Vitamin D deficiency, unspecified: Secondary | ICD-10-CM | POA: Diagnosis not present

## 2022-02-22 DIAGNOSIS — Z9189 Other specified personal risk factors, not elsewhere classified: Secondary | ICD-10-CM | POA: Diagnosis not present

## 2022-02-22 DIAGNOSIS — R7301 Impaired fasting glucose: Secondary | ICD-10-CM | POA: Diagnosis not present

## 2022-03-08 DIAGNOSIS — H02826 Cysts of left eye, unspecified eyelid: Secondary | ICD-10-CM | POA: Diagnosis not present

## 2022-03-13 DIAGNOSIS — G4733 Obstructive sleep apnea (adult) (pediatric): Secondary | ICD-10-CM | POA: Diagnosis not present

## 2022-03-14 DIAGNOSIS — G4733 Obstructive sleep apnea (adult) (pediatric): Secondary | ICD-10-CM | POA: Diagnosis not present

## 2022-04-10 DIAGNOSIS — R7301 Impaired fasting glucose: Secondary | ICD-10-CM | POA: Diagnosis not present

## 2022-04-10 DIAGNOSIS — M25561 Pain in right knee: Secondary | ICD-10-CM | POA: Diagnosis not present

## 2022-04-13 DIAGNOSIS — G4733 Obstructive sleep apnea (adult) (pediatric): Secondary | ICD-10-CM | POA: Diagnosis not present

## 2022-04-18 DIAGNOSIS — L821 Other seborrheic keratosis: Secondary | ICD-10-CM | POA: Diagnosis not present

## 2022-04-18 DIAGNOSIS — H0279 Other degenerative disorders of eyelid and periocular area: Secondary | ICD-10-CM | POA: Diagnosis not present

## 2022-04-18 DIAGNOSIS — H02831 Dermatochalasis of right upper eyelid: Secondary | ICD-10-CM | POA: Diagnosis not present

## 2022-04-18 DIAGNOSIS — D485 Neoplasm of uncertain behavior of skin: Secondary | ICD-10-CM | POA: Diagnosis not present

## 2022-04-18 DIAGNOSIS — H02834 Dermatochalasis of left upper eyelid: Secondary | ICD-10-CM | POA: Diagnosis not present

## 2022-05-14 DIAGNOSIS — G4733 Obstructive sleep apnea (adult) (pediatric): Secondary | ICD-10-CM | POA: Diagnosis not present

## 2022-05-29 DIAGNOSIS — G8929 Other chronic pain: Secondary | ICD-10-CM | POA: Diagnosis not present

## 2022-05-29 DIAGNOSIS — R600 Localized edema: Secondary | ICD-10-CM | POA: Diagnosis not present

## 2022-05-29 DIAGNOSIS — M25561 Pain in right knee: Secondary | ICD-10-CM | POA: Diagnosis not present

## 2022-05-29 DIAGNOSIS — E1169 Type 2 diabetes mellitus with other specified complication: Secondary | ICD-10-CM | POA: Diagnosis not present

## 2022-05-31 ENCOUNTER — Ambulatory Visit: Payer: BC Managed Care – PPO | Admitting: Family Medicine

## 2022-05-31 ENCOUNTER — Ambulatory Visit (INDEPENDENT_AMBULATORY_CARE_PROVIDER_SITE_OTHER): Payer: BC Managed Care – PPO

## 2022-05-31 ENCOUNTER — Ambulatory Visit: Payer: Self-pay

## 2022-05-31 VITALS — BP 130/80 | HR 69 | Ht 65.0 in | Wt 360.0 lb

## 2022-05-31 DIAGNOSIS — M25561 Pain in right knee: Secondary | ICD-10-CM | POA: Diagnosis not present

## 2022-05-31 NOTE — Progress Notes (Signed)
Elizabeth Escobar is a 41 y.o. female who presents to [place] at [facility] today for right knee pain. Patient was last seen by Dr. Georgina Snell on 03/11/20 for hip pain and receive a steroid injection. Patient is wanting to get an injection in the right knee. Patient states the right knee pain has been going on for years and locates pain to patella. Patients weight doctor wanted her to come in to see our office about her knee pain.  She was seen previously for meralgia paresthetica left side.  She notes the injection helped temporarily but did not provide lasting relief.  Radiates: no Mechanical symptoms: yes Numbness/tingling: no Weakness: sometimes  Aggravates: walking around  Treatments tried: wearing tape, voltaren gel    Pertinent review of systems: No fevers or chills  Relevant historical information: Obesity.  Meralgia paresthetica.  Knee DJD.   Exam:  BP 130/80   Pulse 69   Ht 5\' 5"  (1.651 m)   Wt (!) 360 lb (163.3 kg)   SpO2 99%   BMI 59.91 kg/m  General: Morbidly obese well nourished, and in no acute distress.   MSK: Right knee: Normal-appearing no significant effusion.  Normal motion with crepitation.  Tender palpation medial joint line.  Stable ligamentous exam.    Lab and Radiology Results  Procedure: Real-time Ultrasound Guided Injection of right knee superior lateral patellar space Device: Philips Affiniti 50G Images permanently stored and available for review in PACS Verbal informed consent obtained.  Discussed risks and benefits of procedure. Warned about infection, bleeding, hyperglycemia damage to structures among others. Patient expresses understanding and agreement Time-out conducted.   Noted no overlying erythema, induration, or other signs of local infection.   Skin prepped in a sterile fashion.   Local anesthesia: Topical Ethyl chloride.   With sterile technique and under real time ultrasound guidance: 40 mg of Kenalog and 2 mL of  Marcaine injected into knee joint. Fluid seen entering the joint capsule.   Completed without difficulty   Pain immediately resolved suggesting accurate placement of the medication.   Advised to call if fevers/chills, erythema, induration, drainage, or persistent bleeding.   Images permanently stored and available for review in the ultrasound unit.  Impression: Technically successful ultrasound guided injection.    X-ray images right knee obtained today personally and independently interpreted Severe medial compartment DJD.  No acute fractures. Await formal radiology review    Assessment and Plan: 41 y.o. female with right knee pain due to DJD exacerbation.  Plan for steroid injection today.  If this does not last we can do hyaluronic acid injections.  Will need to authorize those injections in the future.  She will let me know how it goes.  She is actively working on weight loss which will be essential.  Recheck back as needed.   PDMP not reviewed this encounter. Orders Placed This Encounter  Procedures   [place] LIMITED JOINT SPACE STRUCTURES LOW RIGHT(NO LINKED CHARGES)    Standing Status:   Future    Number of Occurrences:   1    Standing Expiration Date:   06/01/2023    Order Specific Question:   Reason for Exam (SYMPTOM  OR DIAGNOSIS REQUIRED)    Answer:   right knee pain    Order Specific Question:   Preferred imaging location?    Answer:   [place]   DG Knee AP/LAT W/Sun[place] Right    Standing Status:   Future    Number of  Occurrences:   1    Standing Expiration Date:   06/01/2023    Order Specific Question:   Reason for Exam (SYMPTOM  OR DIAGNOSIS REQUIRED)    Answer:   eval knee pain    Order Specific Question:   Is patient pregnant?    Answer:   No    Order Specific Question:   Preferred imaging location?    Answer:   [facility]   No orders of the defined types were placed in this encounter.    Discussed warning signs or  symptoms. Please see discharge instructions. Patient expresses understanding.   The above documentation has been reviewed and is accurate and complete Clementeen Graham, M.D.

## 2022-05-31 NOTE — Patient Instructions (Addendum)
Thank you for coming in today.   Please get an Xray today before you leave   Call or go to the ER if you develop a large red swollen joint with extreme pain or oozing puss.    If this shot does not last longer than 3 months let me know we can do something else.

## 2022-06-02 DIAGNOSIS — D485 Neoplasm of uncertain behavior of skin: Secondary | ICD-10-CM | POA: Diagnosis not present

## 2022-06-02 DIAGNOSIS — D23122 Other benign neoplasm of skin of left lower eyelid, including canthus: Secondary | ICD-10-CM | POA: Diagnosis not present

## 2022-06-02 NOTE — Progress Notes (Signed)
Right knee x-ray shows advanced arthritis changes.

## 2022-06-14 DIAGNOSIS — G4733 Obstructive sleep apnea (adult) (pediatric): Secondary | ICD-10-CM | POA: Diagnosis not present

## 2022-07-12 DIAGNOSIS — G4733 Obstructive sleep apnea (adult) (pediatric): Secondary | ICD-10-CM | POA: Diagnosis not present

## 2022-07-12 DIAGNOSIS — R600 Localized edema: Secondary | ICD-10-CM | POA: Diagnosis not present

## 2022-07-12 DIAGNOSIS — R7301 Impaired fasting glucose: Secondary | ICD-10-CM | POA: Diagnosis not present

## 2022-07-12 DIAGNOSIS — E65 Localized adiposity: Secondary | ICD-10-CM | POA: Diagnosis not present

## 2022-07-15 DIAGNOSIS — G4733 Obstructive sleep apnea (adult) (pediatric): Secondary | ICD-10-CM | POA: Diagnosis not present

## 2022-08-12 DIAGNOSIS — G4733 Obstructive sleep apnea (adult) (pediatric): Secondary | ICD-10-CM | POA: Diagnosis not present

## 2022-08-15 DIAGNOSIS — G4733 Obstructive sleep apnea (adult) (pediatric): Secondary | ICD-10-CM | POA: Diagnosis not present

## 2022-09-10 DIAGNOSIS — G4733 Obstructive sleep apnea (adult) (pediatric): Secondary | ICD-10-CM | POA: Diagnosis not present

## 2022-09-11 DIAGNOSIS — G4733 Obstructive sleep apnea (adult) (pediatric): Secondary | ICD-10-CM | POA: Diagnosis not present

## 2022-09-11 DIAGNOSIS — R7301 Impaired fasting glucose: Secondary | ICD-10-CM | POA: Diagnosis not present

## 2022-09-11 DIAGNOSIS — E65 Localized adiposity: Secondary | ICD-10-CM | POA: Diagnosis not present

## 2022-09-11 DIAGNOSIS — Z6841 Body Mass Index (BMI) 40.0 and over, adult: Secondary | ICD-10-CM | POA: Diagnosis not present

## 2022-09-20 ENCOUNTER — Ambulatory Visit: Payer: BC Managed Care – PPO | Admitting: Family Medicine

## 2022-09-20 ENCOUNTER — Other Ambulatory Visit: Payer: Self-pay

## 2022-09-20 ENCOUNTER — Encounter: Payer: Self-pay | Admitting: Family Medicine

## 2022-09-20 VITALS — BP 110/64 | HR 92 | Ht 65.0 in | Wt 338.0 lb

## 2022-09-20 DIAGNOSIS — G8929 Other chronic pain: Secondary | ICD-10-CM

## 2022-09-20 DIAGNOSIS — M25561 Pain in right knee: Secondary | ICD-10-CM | POA: Diagnosis not present

## 2022-09-20 DIAGNOSIS — M1711 Unilateral primary osteoarthritis, right knee: Secondary | ICD-10-CM

## 2022-09-20 NOTE — Progress Notes (Signed)
I, Josepha Pigg, CMA acting as a scribe for Lynne Leader, MD.  Elizabeth Escobar is a 42 y.o. female who presents to [place] at [facility] today for exacerbation of right knee pain.  Patient was last seen by Dr. Georgina Snell on 05/31/2022 and was given a right knee steroid injection and was advised to continue working on weight loss.  Today, patient reports worsening of right knee sx over the past week. Denies swelling. Mechanical sx present. Denies radicular sx. Has tried Advil with minimal relief. Has not needed Voltaren since prior to last inj.   Dx imaging: 05/31/2022 R knee XR  Pertinent review of systems: No fevers or chills  Relevant historical information: Sleep apnea.  Obesity.   Exam:  BP 110/64   Pulse 92   Ht 5\' 5"  (1.651 m)   Wt (!) 338 lb (153.3 kg)   SpO2 99%   BMI 56.25 kg/m  General: Well Developed, well nourished, and in no acute distress.   MSK: Right knee: Large knee due to body habitus.  Effusion difficult to appreciate. Normal motion with crepitation.  Tender palpation medial joint line. Stable ligamentous exam. Intact strength.    Lab and Radiology Results  Procedure: Real-time Ultrasound Guided Injection of right knee superior lateral patellar space Device: Philips Affiniti 50G Images permanently stored and available for review in PACS Verbal informed consent obtained.  Discussed risks and benefits of procedure. Warned about infection, bleeding, hyperglycemia damage to structures among others. Patient expresses understanding and agreement Time-out conducted.   Noted no overlying erythema, induration, or other signs of local infection.   Skin prepped in a sterile fashion.   Local anesthesia: Topical Ethyl chloride.   With sterile technique and under real time ultrasound guidance: 40 mg of Kenalog and 2 mL Marcaine  injected into knee joint. Fluid seen entering the joint capsule.   Completed without difficulty   Pain immediately  resolved suggesting accurate placement of the medication.   Advised to call if fevers/chills, erythema, induration, drainage, or persistent bleeding.   Images permanently stored and available for review in the ultrasound unit.  Impression: Technically successful ultrasound guided injection. Spinal needle used   EXAM: RIGHT KNEE 3 VIEWS   COMPARISON:  Radiographs 04/13/2017   FINDINGS: No acute fracture or dislocation. Progression of hypertrophic degenerative arthritis in the right knee greatest in the medial compartment where there is advanced narrowing with almost complete loss of joint space. Slight lateral subluxation of the tibia in relation to the femur. Small knee joint effusion.   IMPRESSION: Advanced medial compartment narrowing, progressed from 2018.     Electronically Signed   By: Placido Sou M.D.   On: 06/01/2022 21:28   I, Lynne Leader, personally (independently) visualized and performed the interpretation of the images attached in this note.      Assessment and Plan: 42 y.o. female with right knee pain due to exacerbation of DJD.  This is an acute exacerbation of a chronic problem.  Plan for steroid injection today.  The last cortisone shot was about 4 months ago.  The shot seems to be lasting about 4 months.  We can do this every 3 months.  If she started to have injections that do not last that long we could move to hyaluronic acid injections or Zilretta injections. Avoid high-dose oral ibuprofen or NSAIDs due to risk of hypertension exacerbation.  PDMP not reviewed this encounter. Orders Placed This Encounter  Procedures   [place] LIMITED JOINT SPACE STRUCTURES  LOW RIGHT(NO LINKED CHARGES)    Order Specific Question:   Reason for Exam (SYMPTOM  OR DIAGNOSIS REQUIRED)    Answer:   right knee pain    Order Specific Question:   Preferred imaging location?    Answer:   [place]   No orders of the defined types were placed in this  encounter.    Discussed warning signs or symptoms. Please see discharge instructions. Patient expresses understanding.   The above documentation has been reviewed and is accurate and complete Lynne Leader, M.D.

## 2022-09-20 NOTE — Patient Instructions (Signed)
You received an injection today. Seek immediate medical attention if the joint becomes red, extremely painful, or is oozing fluid.  

## 2022-10-11 DIAGNOSIS — G4733 Obstructive sleep apnea (adult) (pediatric): Secondary | ICD-10-CM | POA: Diagnosis not present

## 2022-11-10 DIAGNOSIS — G4733 Obstructive sleep apnea (adult) (pediatric): Secondary | ICD-10-CM | POA: Diagnosis not present

## 2022-11-28 ENCOUNTER — Other Ambulatory Visit (HOSPITAL_COMMUNITY): Payer: Self-pay

## 2022-11-28 MED ORDER — ZEPBOUND 5 MG/0.5ML ~~LOC~~ SOAJ
5.0000 mg | SUBCUTANEOUS | 0 refills | Status: AC
  Filled 2022-11-28: qty 2, 28d supply, fill #0

## 2022-11-29 ENCOUNTER — Other Ambulatory Visit (HOSPITAL_COMMUNITY): Payer: Self-pay

## 2022-11-29 MED ORDER — ZEPBOUND 5 MG/0.5ML ~~LOC~~ SOAJ: SUBCUTANEOUS | 0 refills | Status: AC

## 2022-12-04 ENCOUNTER — Ambulatory Visit: Payer: BC Managed Care – PPO | Admitting: Family Medicine

## 2022-12-04 ENCOUNTER — Encounter: Payer: Self-pay | Admitting: Family Medicine

## 2022-12-04 ENCOUNTER — Telehealth: Payer: Self-pay

## 2022-12-04 ENCOUNTER — Other Ambulatory Visit: Payer: Self-pay

## 2022-12-04 VITALS — BP 138/88 | HR 77 | Ht 64.5 in | Wt 351.4 lb

## 2022-12-04 DIAGNOSIS — G8929 Other chronic pain: Secondary | ICD-10-CM

## 2022-12-04 DIAGNOSIS — M25561 Pain in right knee: Secondary | ICD-10-CM | POA: Diagnosis not present

## 2022-12-04 DIAGNOSIS — M1711 Unilateral primary osteoarthritis, right knee: Secondary | ICD-10-CM

## 2022-12-04 NOTE — Telephone Encounter (Signed)
Rodolph Bong, MD  Dierdre Searles, CMA Please Mason Jim and gel shots right knee. Zilretta preferred right knee

## 2022-12-04 NOTE — Progress Notes (Signed)
   I, Stevenson Clinch, CMA acting as a scribe for Clementeen Graham, MD.  Elizabeth Escobar is a 42 y.o. female who presents to [facility] at [facility] today for cont'd R knee pain. Pt was last seen by Dr. Denyse Amass on 09/20/22 and was given a R knee steroid injection. Today, pt reports improvement of knee sx s/p injection. Notes worsening pain over the past week. Has been popping more often. Notes some weight gain while being off of weight loss medication. Denies visible swelling.   Dx imaging: 05/31/2022 R knee XR   Pertinent review of systems: No fevers or chills  Relevant historical information: Sleep apnea.  Obesity.   Exam:  BP 138/88   Pulse 77   Ht 5' 4.5" (1.638 m)   Wt (!) 351 lb 6.4 oz (159.4 kg)   SpO2 99%   BMI 59.39 kg/m  General: Well Developed, well nourished, and in no acute distress.   MSK: Right knee mild effusion normal motion.       Assessment and Plan: 42 y.o. female with right knee pain due to significant medial compartment DJD.  She had a steroid injection about 2 months ago.  Her 66-month mark would be June 20.  Steroid injection unfortunately did not last longer than 3 months.  Will start authorization now for Zilretta or hyaluronic acid injections.  Elizabeth Escobar is the preferred agent as it will be more reliable and should last at least 3 months.  She still working on weight loss which should be helpful.   PDMP not reviewed this encounter. Orders Placed This Encounter  Procedures   [place] LIMITED JOINT SPACE STRUCTURES LOW RIGHT(NO LINKED CHARGES)    Order Specific Question:   Reason for Exam (SYMPTOM  OR DIAGNOSIS REQUIRED)    Answer:   right knee pain    Order Specific Question:   Preferred imaging location?    Answer:   [facility]   No orders of the defined types were placed in this encounter.    Discussed warning signs or symptoms. Please see discharge instructions. Patient expresses understanding.   The above  documentation has been reviewed and is accurate and complete Clementeen Graham, M.D.

## 2022-12-04 NOTE — Telephone Encounter (Signed)
Corey, Evan S, MD  Shannah Conteh S, CMA Please auth Zilretta and gel shots right knee. Zilretta preferred right knee 

## 2022-12-04 NOTE — Patient Instructions (Signed)
Thank you for coming in today.   We will work to authorize gel shots and Designer, multimedia.

## 2022-12-05 NOTE — Telephone Encounter (Signed)
VOB initiated for Zilretta for RIGHT knee OA.  

## 2022-12-05 NOTE — Telephone Encounter (Signed)
VOB initiated for Orthovisc for RIGHT knee OA.   Also checking Zilretta.

## 2022-12-06 NOTE — Telephone Encounter (Signed)
Zilretta for RIGHT knee OA (Also checking Orthovisc)  Primary Insurance[place] [employer] [place] Co-pay: $40 Co-Insurance: 30%  Deductible: $230.39 of $800 met - must be met for coverage to apply Prior Auth NOT required     05/31/22 Kenalog RIGHT knee 09/20/22 Kenalog RIGHT knee

## 2022-12-06 NOTE — Telephone Encounter (Signed)
Prior auth required for ORTHOVISC     

## 2022-12-08 NOTE — Telephone Encounter (Signed)
Waiting on Orthovisc. 

## 2022-12-08 NOTE — Telephone Encounter (Signed)
Prior auth initiated via FAX ?

## 2022-12-11 DIAGNOSIS — G4733 Obstructive sleep apnea (adult) (pediatric): Secondary | ICD-10-CM | POA: Diagnosis not present

## 2022-12-13 ENCOUNTER — Other Ambulatory Visit (HOSPITAL_COMMUNITY): Payer: Self-pay

## 2022-12-13 DIAGNOSIS — Z9189 Other specified personal risk factors, not elsewhere classified: Secondary | ICD-10-CM | POA: Diagnosis not present

## 2022-12-13 DIAGNOSIS — E65 Localized adiposity: Secondary | ICD-10-CM | POA: Diagnosis not present

## 2022-12-13 DIAGNOSIS — G8929 Other chronic pain: Secondary | ICD-10-CM | POA: Diagnosis not present

## 2022-12-13 DIAGNOSIS — M25561 Pain in right knee: Secondary | ICD-10-CM | POA: Diagnosis not present

## 2022-12-13 MED ORDER — ZEPBOUND 7.5 MG/0.5ML ~~LOC~~ SOAJ
7.5000 mg | SUBCUTANEOUS | 0 refills | Status: DC
  Filled 2022-12-13: qty 2, 28d supply, fill #0

## 2022-12-13 NOTE — Telephone Encounter (Signed)
Patient confirmation fax received from [facility].   Form completed and placed at the front desk for faxing along with original prior auth request.

## 2022-12-27 ENCOUNTER — Other Ambulatory Visit: Payer: Self-pay

## 2022-12-27 ENCOUNTER — Ambulatory Visit: Payer: BC Managed Care – PPO | Admitting: Family Medicine

## 2022-12-27 DIAGNOSIS — G8929 Other chronic pain: Secondary | ICD-10-CM | POA: Diagnosis not present

## 2022-12-27 DIAGNOSIS — M1711 Unilateral primary osteoarthritis, right knee: Secondary | ICD-10-CM | POA: Diagnosis not present

## 2022-12-27 DIAGNOSIS — M25561 Pain in right knee: Secondary | ICD-10-CM | POA: Diagnosis not present

## 2022-12-27 MED ORDER — TRIAMCINOLONE ACETONIDE 32 MG IX SRER
32.0000 mg | Freq: Once | INTRA_ARTICULAR | Status: AC
  Administered 2022-12-27: 32 mg via INTRA_ARTICULAR

## 2022-12-27 NOTE — Patient Instructions (Addendum)
Thank you for coming in today.   You received an injection today. Seek immediate medical attention if the joint becomes red, extremely painful, or is oozing fluid.   I can repeat this injection every 3 months if needed.   I just need some heads up so we can get authorization from your insurance.

## 2022-12-27 NOTE — Progress Notes (Signed)
Elizabeth Escobar presents to clinic today for Zilretta injection right knee Procedure: Real-time Ultrasound Guided Injection of right knee joint superior lateral patella space Device: Philips Affiniti 50G/GE Logiq Images permanently stored and available for review in PACS Verbal informed consent obtained.  Discussed risks and benefits of procedure. Warned about infection, bleeding, hyperglycemia damage to structures among others. Patient expresses understanding and agreement Time-out conducted.   Noted no overlying erythema, induration, or other signs of local infection.   Skin prepped in a sterile fashion.   Local anesthesia: Topical Ethyl chloride.   With sterile technique and under real time ultrasound guidance: Zilretta 32 mg of Marcaine injected into knee joint. Fluid seen entering the joint capsule.   Completed without difficulty  .   Advised to call if fevers/chills, erythema, induration, drainage, or persistent bleeding.   Images permanently stored and available for review in the ultrasound unit.  Impression: Technically successful ultrasound guided injection. Lot number: [id number]

## 2023-01-01 NOTE — Telephone Encounter (Signed)
Pt received Zilretta inj for RIGHT knee OA on 12/27/22. Can consider repeat inj on or after 03/22/23.

## 2023-01-15 ENCOUNTER — Other Ambulatory Visit (HOSPITAL_COMMUNITY): Payer: Self-pay

## 2023-01-15 MED ORDER — ZEPBOUND 7.5 MG/0.5ML ~~LOC~~ SOAJ
7.5000 mg | SUBCUTANEOUS | 0 refills | Status: DC
  Filled 2023-01-15: qty 2, 28d supply, fill #0

## 2023-01-16 NOTE — Telephone Encounter (Signed)
Prior auth and clinical ntes faxed to [employer] at [phone].

## 2023-01-17 DIAGNOSIS — R92313 Mammographic fatty tissue density, bilateral breasts: Secondary | ICD-10-CM | POA: Diagnosis not present

## 2023-01-17 DIAGNOSIS — G4733 Obstructive sleep apnea (adult) (pediatric): Secondary | ICD-10-CM | POA: Diagnosis not present

## 2023-01-17 DIAGNOSIS — Z1231 Encounter for screening mammogram for malignant neoplasm of breast: Secondary | ICD-10-CM | POA: Diagnosis not present

## 2023-01-22 NOTE — Telephone Encounter (Signed)
Called [employer] systems engineer.   Prior auth for Orthovisc for RIGHT knee OA initiated via PHONE.  Tracking # [id number]

## 2023-01-24 DIAGNOSIS — G4733 Obstructive sleep apnea (adult) (pediatric): Secondary | ICD-10-CM | POA: Diagnosis not present

## 2023-01-24 DIAGNOSIS — E559 Vitamin D deficiency, unspecified: Secondary | ICD-10-CM | POA: Diagnosis not present

## 2023-01-24 DIAGNOSIS — Z0000 Encounter for general adult medical examination without abnormal findings: Secondary | ICD-10-CM | POA: Diagnosis not present

## 2023-01-24 DIAGNOSIS — E88819 Insulin resistance, unspecified: Secondary | ICD-10-CM | POA: Diagnosis not present

## 2023-01-24 DIAGNOSIS — Z1331 Encounter for screening for depression: Secondary | ICD-10-CM | POA: Diagnosis not present

## 2023-01-25 NOTE — Telephone Encounter (Signed)
Faxed request received from [facility] requesting additional information.   Form completed and faxed back.

## 2023-01-26 NOTE — Telephone Encounter (Signed)
Since pt had Zilretta (12/27/22), she must wait 6 months to do Orthovisc.  Call Back (if needed) # [phone] Maudry Mayhew.

## 2023-01-31 DIAGNOSIS — G4733 Obstructive sleep apnea (adult) (pediatric): Secondary | ICD-10-CM | POA: Diagnosis not present

## 2023-01-31 DIAGNOSIS — Z9189 Other specified personal risk factors, not elsewhere classified: Secondary | ICD-10-CM | POA: Diagnosis not present

## 2023-01-31 DIAGNOSIS — R7301 Impaired fasting glucose: Secondary | ICD-10-CM | POA: Diagnosis not present

## 2023-01-31 DIAGNOSIS — E559 Vitamin D deficiency, unspecified: Secondary | ICD-10-CM | POA: Diagnosis not present

## 2023-02-12 ENCOUNTER — Other Ambulatory Visit (HOSPITAL_COMMUNITY): Payer: Self-pay

## 2023-02-12 MED ORDER — ZEPBOUND 7.5 MG/0.5ML ~~LOC~~ SOAJ
7.5000 mg | SUBCUTANEOUS | 0 refills | Status: DC
  Filled 2023-02-12: qty 2, 28d supply, fill #0

## 2023-02-17 DIAGNOSIS — G4733 Obstructive sleep apnea (adult) (pediatric): Secondary | ICD-10-CM | POA: Diagnosis not present

## 2023-03-06 NOTE — Telephone Encounter (Signed)
VOB initiated for ZILRETTA for RIGHT knee OA.

## 2023-03-07 NOTE — Telephone Encounter (Signed)
Zilretta for RIGHT knee OA OK to scheduled on or after 03/22/23   Primary Insurance: [employer] [place] Co-pay: $40 Co-Insurance: 30%  Deductible: $800 of $800 met Prior Auth NOT required       Knee Injection History:  05/31/22 - Kenalog RIGHT 09/20/22 - Kenalog RIGHT 12/27/22 - Zilretta RIGHT

## 2023-03-07 NOTE — Telephone Encounter (Signed)
Holding until needed.

## 2023-03-07 NOTE — Telephone Encounter (Signed)
 NO Prior Auth required for [employer]

## 2023-03-14 DIAGNOSIS — M25561 Pain in right knee: Secondary | ICD-10-CM | POA: Diagnosis not present

## 2023-03-14 DIAGNOSIS — G8929 Other chronic pain: Secondary | ICD-10-CM | POA: Diagnosis not present

## 2023-03-14 DIAGNOSIS — E559 Vitamin D deficiency, unspecified: Secondary | ICD-10-CM | POA: Diagnosis not present

## 2023-03-15 ENCOUNTER — Other Ambulatory Visit (HOSPITAL_COMMUNITY): Payer: Self-pay

## 2023-03-15 MED ORDER — ZEPBOUND 7.5 MG/0.5ML ~~LOC~~ SOAJ
7.5000 mg | SUBCUTANEOUS | 1 refills | Status: AC
  Filled 2023-03-15: qty 2, 28d supply, fill #0
  Filled 2023-08-01 – 2023-10-11 (×3): qty 2, 28d supply, fill #1

## 2023-03-20 DIAGNOSIS — G4733 Obstructive sleep apnea (adult) (pediatric): Secondary | ICD-10-CM | POA: Diagnosis not present

## 2023-04-09 DIAGNOSIS — Z1151 Encounter for screening for human papillomavirus (HPV): Secondary | ICD-10-CM | POA: Diagnosis not present

## 2023-04-09 DIAGNOSIS — Z124 Encounter for screening for malignant neoplasm of cervix: Secondary | ICD-10-CM | POA: Diagnosis not present

## 2023-04-09 DIAGNOSIS — N870 Mild cervical dysplasia: Secondary | ICD-10-CM | POA: Diagnosis not present

## 2023-04-09 DIAGNOSIS — R87611 Atypical squamous cells cannot exclude high grade squamous intraepithelial lesion on cytologic smear of cervix (ASC-H): Secondary | ICD-10-CM | POA: Diagnosis not present

## 2023-04-11 ENCOUNTER — Other Ambulatory Visit (HOSPITAL_COMMUNITY): Payer: Self-pay

## 2023-04-11 MED ORDER — ZEPBOUND 7.5 MG/0.5ML ~~LOC~~ SOAJ
7.5000 mg | SUBCUTANEOUS | 1 refills | Status: AC
  Filled 2023-04-11: qty 2, 28d supply, fill #0

## 2023-04-19 DIAGNOSIS — G4733 Obstructive sleep apnea (adult) (pediatric): Secondary | ICD-10-CM | POA: Diagnosis not present

## 2023-05-02 DIAGNOSIS — J40 Bronchitis, not specified as acute or chronic: Secondary | ICD-10-CM | POA: Diagnosis not present

## 2023-05-07 ENCOUNTER — Other Ambulatory Visit (HOSPITAL_COMMUNITY): Payer: Self-pay

## 2023-05-07 MED ORDER — ZEPBOUND 7.5 MG/0.5ML ~~LOC~~ SOAJ
7.5000 mg | SUBCUTANEOUS | 1 refills | Status: AC
  Filled 2023-05-07: qty 2, 28d supply, fill #0

## 2023-05-20 DIAGNOSIS — G4733 Obstructive sleep apnea (adult) (pediatric): Secondary | ICD-10-CM | POA: Diagnosis not present

## 2023-06-06 ENCOUNTER — Other Ambulatory Visit (HOSPITAL_COMMUNITY): Payer: Self-pay

## 2023-06-06 MED ORDER — ZEPBOUND 7.5 MG/0.5ML ~~LOC~~ SOAJ
7.5000 mg | SUBCUTANEOUS | 1 refills | Status: AC
  Filled 2023-06-06: qty 2, 28d supply, fill #0

## 2023-06-19 DIAGNOSIS — G4733 Obstructive sleep apnea (adult) (pediatric): Secondary | ICD-10-CM | POA: Diagnosis not present

## 2023-07-01 ENCOUNTER — Other Ambulatory Visit (HOSPITAL_COMMUNITY): Payer: Self-pay

## 2023-07-01 MED ORDER — ZEPBOUND 7.5 MG/0.5ML ~~LOC~~ SOAJ
7.5000 mg | SUBCUTANEOUS | 1 refills | Status: AC
Start: 2023-06-29 — End: ?
  Filled 2023-07-01: qty 2, 28d supply, fill #0
  Filled 2023-08-03 – 2023-08-24 (×5): qty 2, 28d supply, fill #1

## 2023-07-05 NOTE — Telephone Encounter (Signed)
 VOB initiated for 2025 coverage of Orthovisc.

## 2023-07-05 NOTE — Telephone Encounter (Signed)
 VOB initiated for 2025 coverage of Zilretta.

## 2023-07-10 NOTE — Telephone Encounter (Signed)
 ZILRETTA  for RIGHT knee OA   Primary Insurance: [employer] [place] Co-pay: $40 Co-insurance: 30% Deductible: $0 of $800 met - must be met for coverage to apply Prior Auth: NOT required under medical benefits   Knee Injection History 05/31/22 - Kenalog  RIGHT 09/20/22 - Kenalog  RIGHT 12/27/22 - Zilretta  RIGHT

## 2023-07-10 NOTE — Telephone Encounter (Signed)
 [employer] - Prior Authorization NOT required  [employer] - NOT covered under pharmacy benefits

## 2023-07-18 DIAGNOSIS — R7301 Impaired fasting glucose: Secondary | ICD-10-CM | POA: Diagnosis not present

## 2023-07-18 DIAGNOSIS — E559 Vitamin D deficiency, unspecified: Secondary | ICD-10-CM | POA: Diagnosis not present

## 2023-07-18 DIAGNOSIS — R051 Acute cough: Secondary | ICD-10-CM | POA: Diagnosis not present

## 2023-07-19 ENCOUNTER — Other Ambulatory Visit (HOSPITAL_COMMUNITY): Payer: Self-pay

## 2023-07-19 MED ORDER — AZITHROMYCIN 250 MG PO TABS
ORAL_TABLET | ORAL | 0 refills | Status: AC
  Filled 2023-07-19: qty 6, 5d supply, fill #0

## 2023-07-20 NOTE — Telephone Encounter (Signed)
[employer] St[employer] - Prior Authorization REQUIRED

## 2023-07-24 DIAGNOSIS — G4733 Obstructive sleep apnea (adult) (pediatric): Secondary | ICD-10-CM | POA: Diagnosis not present

## 2023-07-25 NOTE — Telephone Encounter (Signed)
Pre-determination initiated for Orthovisc with [employer] [place] via FAX form.

## 2023-08-01 ENCOUNTER — Other Ambulatory Visit (HOSPITAL_COMMUNITY): Payer: Self-pay

## 2023-08-03 ENCOUNTER — Other Ambulatory Visit (HOSPITAL_COMMUNITY): Payer: Self-pay

## 2023-08-08 ENCOUNTER — Other Ambulatory Visit (HOSPITAL_COMMUNITY): Payer: Self-pay

## 2023-08-10 ENCOUNTER — Other Ambulatory Visit (HOSPITAL_COMMUNITY): Payer: Self-pay

## 2023-08-15 ENCOUNTER — Other Ambulatory Visit (HOSPITAL_COMMUNITY): Payer: Self-pay

## 2023-08-16 ENCOUNTER — Other Ambulatory Visit (HOSPITAL_COMMUNITY): Payer: Self-pay

## 2023-08-21 NOTE — Telephone Encounter (Signed)
 Prior Auth form completed and faxed to [employer].

## 2023-08-24 ENCOUNTER — Other Ambulatory Visit (HOSPITAL_COMMUNITY): Payer: Self-pay

## 2023-08-24 DIAGNOSIS — G4733 Obstructive sleep apnea (adult) (pediatric): Secondary | ICD-10-CM | POA: Diagnosis not present

## 2023-08-27 ENCOUNTER — Other Ambulatory Visit (HOSPITAL_COMMUNITY): Payer: Self-pay

## 2023-08-27 DIAGNOSIS — E65 Localized adiposity: Secondary | ICD-10-CM | POA: Diagnosis not present

## 2023-08-27 DIAGNOSIS — G4733 Obstructive sleep apnea (adult) (pediatric): Secondary | ICD-10-CM | POA: Diagnosis not present

## 2023-08-27 DIAGNOSIS — M25561 Pain in right knee: Secondary | ICD-10-CM | POA: Diagnosis not present

## 2023-08-27 DIAGNOSIS — R600 Localized edema: Secondary | ICD-10-CM | POA: Diagnosis not present

## 2023-08-27 MED ORDER — ZEPBOUND 10 MG/0.5ML ~~LOC~~ SOAJ
10.0000 mg | SUBCUTANEOUS | 1 refills | Status: AC
Start: 2023-08-27 — End: ?
  Filled 2023-08-27 – 2023-09-17 (×2): qty 2, 28d supply, fill #0
  Filled 2023-10-11: qty 2, 28d supply, fill #1

## 2023-09-17 ENCOUNTER — Other Ambulatory Visit (HOSPITAL_COMMUNITY): Payer: Self-pay

## 2023-09-21 DIAGNOSIS — G4733 Obstructive sleep apnea (adult) (pediatric): Secondary | ICD-10-CM | POA: Diagnosis not present

## 2023-10-08 DIAGNOSIS — G4733 Obstructive sleep apnea (adult) (pediatric): Secondary | ICD-10-CM | POA: Diagnosis not present

## 2023-10-08 DIAGNOSIS — R600 Localized edema: Secondary | ICD-10-CM | POA: Diagnosis not present

## 2023-10-08 DIAGNOSIS — G8929 Other chronic pain: Secondary | ICD-10-CM | POA: Diagnosis not present

## 2023-10-08 DIAGNOSIS — M25561 Pain in right knee: Secondary | ICD-10-CM | POA: Diagnosis not present

## 2023-10-08 NOTE — Telephone Encounter (Signed)
 Patient has not been seen in almost a year and has no follow up with out office. Will delay on running patient until they call and need the injection

## 2023-10-11 ENCOUNTER — Other Ambulatory Visit: Payer: Self-pay

## 2023-10-11 ENCOUNTER — Other Ambulatory Visit (HOSPITAL_COMMUNITY): Payer: Self-pay

## 2023-10-12 ENCOUNTER — Other Ambulatory Visit (HOSPITAL_COMMUNITY): Payer: Self-pay

## 2023-10-12 MED ORDER — ZEPBOUND 10 MG/0.5ML ~~LOC~~ SOAJ
10.0000 mg | SUBCUTANEOUS | 1 refills | Status: AC
Start: 2023-10-12 — End: ?
  Filled 2024-01-02 – 2024-01-08 (×2): qty 2, 28d supply, fill #0

## 2023-10-12 MED ORDER — PHENTERMINE HCL 37.5 MG PO TABS
18.7500 mg | ORAL_TABLET | Freq: Every morning | ORAL | 0 refills | Status: DC
Start: 2023-10-12 — End: 2023-12-07
  Filled 2023-10-12: qty 30, 30d supply, fill #0

## 2023-10-15 ENCOUNTER — Other Ambulatory Visit (HOSPITAL_COMMUNITY): Payer: Self-pay

## 2023-10-24 DIAGNOSIS — Z113 Encounter for screening for infections with a predominantly sexual mode of transmission: Secondary | ICD-10-CM | POA: Diagnosis not present

## 2023-10-24 DIAGNOSIS — Z114 Encounter for screening for human immunodeficiency virus [HIV]: Secondary | ICD-10-CM | POA: Diagnosis not present

## 2023-11-05 ENCOUNTER — Other Ambulatory Visit (HOSPITAL_COMMUNITY): Payer: Self-pay

## 2023-11-05 MED ORDER — ZEPBOUND 10 MG/0.5ML ~~LOC~~ SOAJ
10.0000 mg | SUBCUTANEOUS | 1 refills | Status: AC
  Filled 2023-11-05: qty 2, 28d supply, fill #0
  Filled 2023-12-10: qty 2, 28d supply, fill #1

## 2023-11-22 DIAGNOSIS — G4733 Obstructive sleep apnea (adult) (pediatric): Secondary | ICD-10-CM | POA: Diagnosis not present

## 2023-12-03 DIAGNOSIS — R600 Localized edema: Secondary | ICD-10-CM | POA: Diagnosis not present

## 2023-12-03 DIAGNOSIS — G8929 Other chronic pain: Secondary | ICD-10-CM | POA: Diagnosis not present

## 2023-12-03 DIAGNOSIS — E65 Localized adiposity: Secondary | ICD-10-CM | POA: Diagnosis not present

## 2023-12-03 DIAGNOSIS — Z9189 Other specified personal risk factors, not elsewhere classified: Secondary | ICD-10-CM | POA: Diagnosis not present

## 2023-12-03 DIAGNOSIS — G4733 Obstructive sleep apnea (adult) (pediatric): Secondary | ICD-10-CM | POA: Diagnosis not present

## 2023-12-07 ENCOUNTER — Other Ambulatory Visit (HOSPITAL_COMMUNITY): Payer: Self-pay

## 2023-12-07 MED ORDER — PHENTERMINE HCL 37.5 MG PO TABS
18.7500 mg | ORAL_TABLET | Freq: Every day | ORAL | 0 refills | Status: DC
  Filled 2023-12-07: qty 30, 30d supply, fill #0

## 2023-12-10 ENCOUNTER — Other Ambulatory Visit (HOSPITAL_COMMUNITY): Payer: Self-pay

## 2023-12-13 ENCOUNTER — Other Ambulatory Visit (HOSPITAL_COMMUNITY): Payer: Self-pay

## 2023-12-13 MED ORDER — VITAMIN D (ERGOCALCIFEROL) 1.25 MG (50000 UNIT) PO CAPS
50000.0000 [IU] | ORAL_CAPSULE | ORAL | 0 refills | Status: AC
  Filled 2023-12-13: qty 4, 28d supply, fill #0
  Filled 2024-01-09: qty 4, 28d supply, fill #1

## 2023-12-17 ENCOUNTER — Other Ambulatory Visit (HOSPITAL_COMMUNITY): Payer: Self-pay

## 2023-12-23 DIAGNOSIS — G4733 Obstructive sleep apnea (adult) (pediatric): Secondary | ICD-10-CM | POA: Diagnosis not present

## 2024-01-02 ENCOUNTER — Other Ambulatory Visit (HOSPITAL_COMMUNITY): Payer: Self-pay

## 2024-01-08 ENCOUNTER — Other Ambulatory Visit (HOSPITAL_COMMUNITY): Payer: Self-pay

## 2024-01-09 ENCOUNTER — Other Ambulatory Visit (HOSPITAL_COMMUNITY): Payer: Self-pay

## 2024-01-14 ENCOUNTER — Other Ambulatory Visit (HOSPITAL_COMMUNITY): Payer: Self-pay

## 2024-01-21 DIAGNOSIS — R92313 Mammographic fatty tissue density, bilateral breasts: Secondary | ICD-10-CM | POA: Diagnosis not present

## 2024-01-21 DIAGNOSIS — Z1231 Encounter for screening mammogram for malignant neoplasm of breast: Secondary | ICD-10-CM | POA: Diagnosis not present

## 2024-01-22 DIAGNOSIS — G4733 Obstructive sleep apnea (adult) (pediatric): Secondary | ICD-10-CM | POA: Diagnosis not present

## 2024-01-30 ENCOUNTER — Encounter (HOSPITAL_COMMUNITY): Payer: Self-pay

## 2024-01-30 ENCOUNTER — Other Ambulatory Visit (HOSPITAL_COMMUNITY): Payer: Self-pay

## 2024-01-30 MED ORDER — PHENTERMINE HCL 37.5 MG PO TABS
18.7500 mg | ORAL_TABLET | ORAL | 0 refills | Status: DC
  Filled 2024-01-30: qty 30, 30d supply, fill #0

## 2024-01-31 ENCOUNTER — Other Ambulatory Visit (HOSPITAL_COMMUNITY): Payer: Self-pay

## 2024-02-01 ENCOUNTER — Other Ambulatory Visit (HOSPITAL_COMMUNITY): Payer: Self-pay

## 2024-02-06 DIAGNOSIS — Z131 Encounter for screening for diabetes mellitus: Secondary | ICD-10-CM | POA: Diagnosis not present

## 2024-02-06 DIAGNOSIS — G4733 Obstructive sleep apnea (adult) (pediatric): Secondary | ICD-10-CM | POA: Diagnosis not present

## 2024-02-06 DIAGNOSIS — Z0000 Encounter for general adult medical examination without abnormal findings: Secondary | ICD-10-CM | POA: Diagnosis not present

## 2024-02-06 DIAGNOSIS — R7301 Impaired fasting glucose: Secondary | ICD-10-CM | POA: Diagnosis not present

## 2024-02-11 ENCOUNTER — Other Ambulatory Visit (HOSPITAL_COMMUNITY): Payer: Self-pay

## 2024-02-11 MED ORDER — WEGOVY 1.7 MG/0.75ML ~~LOC~~ SOAJ
1.7000 mg | SUBCUTANEOUS | 1 refills | Status: AC
  Filled 2024-02-11: qty 3, 28d supply, fill #0

## 2024-02-20 DIAGNOSIS — G8929 Other chronic pain: Secondary | ICD-10-CM | POA: Diagnosis not present

## 2024-02-20 DIAGNOSIS — G4733 Obstructive sleep apnea (adult) (pediatric): Secondary | ICD-10-CM | POA: Diagnosis not present

## 2024-02-20 DIAGNOSIS — R600 Localized edema: Secondary | ICD-10-CM | POA: Diagnosis not present

## 2024-02-20 DIAGNOSIS — E65 Localized adiposity: Secondary | ICD-10-CM | POA: Diagnosis not present

## 2024-03-04 ENCOUNTER — Other Ambulatory Visit (HOSPITAL_COMMUNITY): Payer: Self-pay

## 2024-03-04 MED ORDER — WEGOVY 1 MG/0.5ML ~~LOC~~ SOAJ
1.0000 mg | SUBCUTANEOUS | 0 refills | Status: DC
  Filled 2024-03-04 – 2024-03-12 (×2): qty 2, 28d supply, fill #0

## 2024-03-05 ENCOUNTER — Other Ambulatory Visit (HOSPITAL_COMMUNITY): Payer: Self-pay

## 2024-03-12 ENCOUNTER — Other Ambulatory Visit (HOSPITAL_COMMUNITY): Payer: Self-pay

## 2024-03-12 DIAGNOSIS — H00012 Hordeolum externum right lower eyelid: Secondary | ICD-10-CM | POA: Diagnosis not present

## 2024-03-13 ENCOUNTER — Other Ambulatory Visit (HOSPITAL_COMMUNITY): Payer: Self-pay

## 2024-03-13 MED ORDER — NEOMYCIN-POLYMYXIN-DEXAMETH 3.5-10000-0.1 OP OINT
1.0000 | TOPICAL_OINTMENT | Freq: Two times a day (BID) | OPHTHALMIC | 0 refills | Status: AC
  Filled 2024-03-13: qty 3.5, 2d supply, fill #0

## 2024-03-24 DIAGNOSIS — H0011 Chalazion right upper eyelid: Secondary | ICD-10-CM | POA: Diagnosis not present

## 2024-03-31 DIAGNOSIS — M25561 Pain in right knee: Secondary | ICD-10-CM | POA: Diagnosis not present

## 2024-03-31 DIAGNOSIS — G8929 Other chronic pain: Secondary | ICD-10-CM | POA: Diagnosis not present

## 2024-04-08 ENCOUNTER — Other Ambulatory Visit (HOSPITAL_COMMUNITY): Payer: Self-pay

## 2024-04-08 MED ORDER — PHENTERMINE HCL 37.5 MG PO TABS
17.7500 mg | ORAL_TABLET | Freq: Every day | ORAL | 0 refills | Status: DC
  Filled 2024-04-08: qty 30, 30d supply, fill #0

## 2024-04-08 MED ORDER — WEGOVY 1 MG/0.5ML ~~LOC~~ SOAJ
1.0000 mg | SUBCUTANEOUS | 0 refills | Status: DC
  Filled 2024-04-08: qty 2, 28d supply, fill #0

## 2024-04-09 ENCOUNTER — Other Ambulatory Visit (HOSPITAL_COMMUNITY): Payer: Self-pay

## 2024-04-22 ENCOUNTER — Other Ambulatory Visit (HOSPITAL_COMMUNITY): Payer: Self-pay

## 2024-04-22 MED ORDER — WEGOVY 1 MG/0.5ML ~~LOC~~ SOAJ: 1.0000 mg | SUBCUTANEOUS | 1 refills | Status: AC

## 2024-05-05 ENCOUNTER — Other Ambulatory Visit (HOSPITAL_COMMUNITY): Payer: Self-pay

## 2024-05-05 MED ORDER — VITAMIN D (ERGOCALCIFEROL) 1.25 MG (50000 UNIT) PO CAPS
50000.0000 [IU] | ORAL_CAPSULE | ORAL | 0 refills | Status: AC
  Filled 2024-05-05: qty 4, 28d supply, fill #0

## 2024-05-05 MED ORDER — WEGOVY 1 MG/0.5ML ~~LOC~~ SOAJ
1.0000 mg | SUBCUTANEOUS | 1 refills | Status: AC
  Filled 2024-05-05: qty 2, 28d supply, fill #0
  Filled 2024-06-03: qty 2, 28d supply, fill #1

## 2024-05-05 MED ORDER — VITAMIN D (ERGOCALCIFEROL) 1.25 MG (50000 UNIT) PO CAPS
50000.0000 [IU] | ORAL_CAPSULE | ORAL | 3 refills | Status: AC
  Filled 2024-05-05: qty 4, 28d supply, fill #0

## 2024-05-06 ENCOUNTER — Other Ambulatory Visit (HOSPITAL_COMMUNITY): Payer: Self-pay

## 2024-06-03 ENCOUNTER — Other Ambulatory Visit (HOSPITAL_COMMUNITY): Payer: Self-pay

## 2024-06-03 MED ORDER — PHENTERMINE HCL 37.5 MG PO TABS
37.5000 mg | ORAL_TABLET | Freq: Every morning | ORAL | 1 refills | Status: AC
  Filled 2024-06-03: qty 30, 30d supply, fill #0

## 2024-06-04 ENCOUNTER — Other Ambulatory Visit (HOSPITAL_COMMUNITY): Payer: Self-pay

## 2024-06-09 DIAGNOSIS — R600 Localized edema: Secondary | ICD-10-CM | POA: Diagnosis not present

## 2024-06-09 DIAGNOSIS — E65 Localized adiposity: Secondary | ICD-10-CM | POA: Diagnosis not present

## 2024-06-09 DIAGNOSIS — G8929 Other chronic pain: Secondary | ICD-10-CM | POA: Diagnosis not present

## 2024-06-09 DIAGNOSIS — G4733 Obstructive sleep apnea (adult) (pediatric): Secondary | ICD-10-CM | POA: Diagnosis not present

## 2024-06-10 ENCOUNTER — Other Ambulatory Visit (HOSPITAL_COMMUNITY): Payer: Self-pay

## 2024-06-10 MED ORDER — WEGOVY 1 MG/0.5ML ~~LOC~~ SOAJ
1.0000 mg | SUBCUTANEOUS | 1 refills | Status: AC
  Filled 2024-06-27: qty 2, 28d supply, fill #0
  Filled 2024-07-25 (×3): qty 2, 28d supply, fill #1

## 2024-06-27 ENCOUNTER — Other Ambulatory Visit (HOSPITAL_COMMUNITY): Payer: Self-pay

## 2024-07-25 ENCOUNTER — Other Ambulatory Visit: Payer: Self-pay

## 2024-07-25 ENCOUNTER — Other Ambulatory Visit (HOSPITAL_COMMUNITY): Payer: Self-pay
